# Patient Record
Sex: Male | Born: 1995
Health system: Southern US, Community
[De-identification: ages and names within clinical notes are randomized; demographics above are authoritative.]

## PROBLEM LIST (undated history)

## (undated) DIAGNOSIS — G801 Spastic diplegic cerebral palsy: Secondary | ICD-10-CM

## (undated) DIAGNOSIS — J45909 Unspecified asthma, uncomplicated: Secondary | ICD-10-CM

## (undated) DIAGNOSIS — L732 Hidradenitis suppurativa: Secondary | ICD-10-CM

## (undated) HISTORY — PX: FOOT CAPSULE RELEASE W/ PERCUTANEOUS HEEL CORD LENGTHENING, TIBIAL TENDON TRANSFER: SHX1658

---
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
## 2017-02-09 ENCOUNTER — Telehealth (INDEPENDENT_AMBULATORY_CARE_PROVIDER_SITE_OTHER): Payer: Self-pay | Admitting: *Deleted

## 2017-02-09 NOTE — Telephone Encounter (Signed)
Please read the aforementioned note.

## 2017-02-09 NOTE — Telephone Encounter (Signed)
  Who's calling (name and relationship to patient) : Elnita MaxwellCheryl, mother  Best contact number: [phone]  Provider they see: Elveria Risingina Goodpasture  Reason for call: Mother called in to get a refill on Adderall.  I have scheduled the follow up appt for 6.07.2018.  Mother will pick up the prescription at this appt.     PRESCRIPTION REFILL ONLY  Name of prescription:  Pharmacy:

## 2017-02-10 NOTE — Telephone Encounter (Signed)
Thank you, I will take care of that at his appointment. TG

## 2017-02-12 ENCOUNTER — Ambulatory Visit (INDEPENDENT_AMBULATORY_CARE_PROVIDER_SITE_OTHER): Payer: BLUE CROSS/BLUE SHIELD | Admitting: Family

## 2017-02-24 ENCOUNTER — Ambulatory Visit (INDEPENDENT_AMBULATORY_CARE_PROVIDER_SITE_OTHER): Payer: BLUE CROSS/BLUE SHIELD | Admitting: Family

## 2017-03-04 ENCOUNTER — Encounter (INDEPENDENT_AMBULATORY_CARE_PROVIDER_SITE_OTHER): Payer: Self-pay | Admitting: Family

## 2017-03-04 ENCOUNTER — Ambulatory Visit (INDEPENDENT_AMBULATORY_CARE_PROVIDER_SITE_OTHER): Payer: BLUE CROSS/BLUE SHIELD | Admitting: Family

## 2017-03-04 VITALS — BP 120/80 | HR 88 | Ht 67.5 in | Wt 242.6 lb

## 2017-03-04 DIAGNOSIS — G808 Other cerebral palsy: Secondary | ICD-10-CM | POA: Diagnosis not present

## 2017-03-04 DIAGNOSIS — F70 Mild intellectual disabilities: Secondary | ICD-10-CM

## 2017-03-04 DIAGNOSIS — F902 Attention-deficit hyperactivity disorder, combined type: Secondary | ICD-10-CM | POA: Diagnosis not present

## 2017-03-04 MED ORDER — AMPHETAMINE-DEXTROAMPHETAMINE 10 MG PO TABS: ORAL_TABLET | ORAL | 0 refills | Status: DC

## 2017-03-04 NOTE — Progress Notes (Signed)
Patient: Shawn Tucker MRN: [id number] Sex: male DOB: 11-19-1995  Provider: Elveria Rising, NP Location of Care: [facility]  Note type: Routine return visit  History of Present Illness History from: patient, [facility] chart and his mother Chief Complaint: Follow up on ADHD  Shawn Tucker is a 21 y.o. young man with history of congenital spastic diplegia and attention deficit disorder.He was last seen July 10, 2016. Shawn Tucker treated by Dr. Willaim Bane at [facility] with a selective dorsal rhizotomy in 2003. He had heel cord lengthening procedures by Dr. Allena Katz at [facility] in successive years. He uses forearm crutches and wears bilateral AFO's. He has been also seen by Dr. Dion Saucier at [facility]. He is now being seen in [place] to consolidate care closer to home. He had a brain MRI scan in the past when the family lived in [place] which apparently was normal.   Shawn Tucker has problems with attention, focus and impulsivity. When he was in school, he took generic Adderall on school days, which helped to focus his attention. Shawn Tucker has graduated from high school and recently completed the [employer] in early June. He is now looking for a part time job and his mother is looking for a class to help him with reading comprehension.  Shawn Tucker has been otherwise generally healthy since last seen. Neither he nor his mother have other health concerns for him today other than previously mentioned.  Review of Systems: Please see the HPI for neurologic and other pertinent review of systems. Otherwise, the following systems are noncontributory including constitutional, eyes, ears, nose and throat, cardiovascular, respiratory, gastrointestinal, genitourinary, musculoskeletal, skin, endocrine, hematologic/lymph, allergic/immunologic and psychiatric.   Past Medical History:  Diagnosis Date  . Right axillary hidradenitis 05/2016   recurrent  . Spastic  diplegic cerebral palsy (HCC)    Hospitalizations: No., Head Injury: No., Nervous System Infections: No., Immunizations up to date: Yes.   Past Medical History Comments: born at [redacted] weeks gestation by repeat c-section, had heart murmur in the nursery, cardiology evaluation was normal. Developmentally he was normal other than gross motor skills. He was not walking at 3 year and seemed to have excessive drooling. He was unable to walk until he had a selective dorsal rhizotomy at age 14 yrs at [facility]. He has had heel cord lengthening surgeries at [facility]. He wears AFO's. He has had a normal brain MRI in the past when he lived in [place]. He was evaluated by Dr Dion Saucier at [facility] and placed on Adderall XR for problems with attention and focus.   Surgical History Past Surgical History:  Procedure Laterality Date  . AXILLARY HIDRADENITIS EXCISION Right 07/10/2015  . EXCISION OF ACCESSORY NAVICULAR WITH ADVANCEMENT OF POSTERIOR TIBIAL TENDON Left 02/23/2014   foot  . FOOT CAPSULE RELEASE W/ PERCUTANEOUS HEEL CORD LENGTHENING, TIBIAL TENDON TRANSFER Bilateral   . HYDRADENITIS EXCISION Right 06/11/2016   Procedure: EXCISION RECURRENT  HIDRADENITIS RIGHT AXILLA;  Surgeon: Manus Rudd, MD;  Location: [facility];  Service: General;  Laterality: Right;  . RHIZOTOMY  2003    Family History family history is not on file. Family History is otherwise negative for migraines, seizures, cognitive impairment, blindness, deafness, birth defects, chromosomal disorder, autism.  Social History Social History   Social History  . Marital status: Single    Spouse name: N/A  . Number of children: N/A  . Years of education: N/A   Social History Main Topics  .  Smoking status: Never Smoker  . Smokeless tobacco: Never Used  . Alcohol use No  . Drug use: No  . Sexual activity: No   Other Topics Concern  . None   Social History Narrative   Shawn Tucker graduated from  [employer]. He is doing well. He enjoys playing basketball, moving the lawn and playing video games. He is working at [employer].    Lives with his parents. He has an older sibling that is adult- aged and does not reside in the home.     Allergies No Known Allergies  Physical Exam BP 120/80   Pulse 88   Ht 5' 7.5" (1.715 m)   Wt 242 lb 9.6 oz (110 kg)   BMI 37.44 kg/m  General: well developed, well nourished young man, seated on exam table, in no evident distress, black hair, dark brown eyes, right handed Head: normocephalic and atraumatic.  Ears, Nose and Throat: Oropharynx benign. Neck: supple with no carotid or supraclavicular bruits. Respiratory: lungs clear to auscultation Cardiovascular: regular rate and rhythm, no murmurs. Musculoskeletal: no apparent scoliosis. bilateral tight heel cords, increased tone in both legs.  Skin: no rashes or lesions  Neurologic Exam  Mental Status: Awake and fully alert. Attention span, concentration, and fund of knowledge diminished for age. Speech with mild dysarthria. Able to follow commands and participate in examination. Immature behavior for age. Cranial Nerves: Fundoscopic exam - sharp disc margin with normal vessels. Pupils equal, briskly reactive to light. Extraocular movements full without nystagmus. Visual fields full to confrontation. Hearing intact and symmetric to finger rub. Facial sensation intact. Face, tongue, palate move normally and symmetrically. Neck flexion and extension normal. Motor: Normal bulk and tone. Normal strength in all tested extremity muscles except for lower extremities which have 4/5 strength with increased tone. Sensory: Intact to touch and temperature in all extremities. Coordination: Good fine motor movements, good finger to nose. Clumsy rapid alternating movements. Heel to shin is clumsy due to to weakness. Romberg is negative Gait and Station: Arises from chair without difficulty.  Stance is normal. Gait demonstrates spastic diplegic gait with clumsy, lurching side to side gait. Ambulation was more stable when using his forearm crutches.  Reflexes: 1+ and symmetric. Toes downgoing. No clonus  Impression 1. Congenital spastic diplegia 2.  Attention deficit disorder, combined type 3.  Mild intellectual delay   Recommendations for plan of care The patient's previous [facility] records were reviewed. Yuvaan has neither had nor required imaging or lab studies since the last visit. He is a 21 year old boy with history of congenital spastic diplegia, mild cognitive impairment and attention deficit disorder with impulsivity and hyperactivity. Cleo takes generic Adderall which has helped to focus his attention. Ezio is otherwise healthy and doing well at this time. I will see him back in follow up in 6 months or sooner if needed.  The medication list was reviewed and reconciled.  No changes were made in the prescribed medications today.  A complete medication list was provided to the patient/caregiver.  Allergies as of 03/04/2017   No Known Allergies     Medication List       Accurate as of 03/04/17  3:11 PM. Always use your most recent med list.          amphetamine-dextroamphetamine 10 MG tablet Commonly known as:  ADDERALL Take 1 tablet in the morning for 1 week, then take 2 tablets each morning       Total time spent with the  patient was 20 minutes, of which 50% or more was spent in counseling and coordination of care.   Elveria Risingina Heydi Swango NP-C

## 2017-03-04 NOTE — Patient Instructions (Signed)
Continue your medication as you have been taking it. Let me know if you have difficulty with attention and focus, especially if you find a job and we need to work on adjusting the dose for your work hours.   Please sign up for MyChart if you have not done so.   Please plan to return for follow up in 6 months or sooner if needed.

## 2017-12-01 ENCOUNTER — Telehealth (INDEPENDENT_AMBULATORY_CARE_PROVIDER_SITE_OTHER): Payer: Self-pay | Admitting: Family

## 2017-12-01 NOTE — Telephone Encounter (Signed)
°  Who's calling (name and relationship to patient) : Elnita MaxwellCheryl (mom)  Best contact number: [phone]  Provider they see: Inetta Fermoina  Reason for call: Requesting that script for Adderall be mailed to home address on file. Please call when script has been mailed off.

## 2017-12-01 NOTE — Telephone Encounter (Signed)
Thank you for talking to Mom. TG 

## 2017-12-01 NOTE — Telephone Encounter (Signed)
Called mother to inform her that it was time for Shawn Tucker to be seen. She stated that she figured and we went ahead with scheduling the follow up. Shawn Tucker has been scheduled for Thursday at 9:30 with Shawn Tucker. She stated that she will just get the refill then

## 2017-12-03 ENCOUNTER — Encounter (INDEPENDENT_AMBULATORY_CARE_PROVIDER_SITE_OTHER): Payer: Self-pay | Admitting: Family

## 2017-12-03 ENCOUNTER — Ambulatory Visit (INDEPENDENT_AMBULATORY_CARE_PROVIDER_SITE_OTHER): Payer: BLUE CROSS/BLUE SHIELD | Admitting: Family

## 2017-12-03 VITALS — BP 110/78 | HR 68 | Ht 69.0 in | Wt 254.8 lb

## 2017-12-03 DIAGNOSIS — Z6837 Body mass index (BMI) 37.0-37.9, adult: Secondary | ICD-10-CM | POA: Diagnosis not present

## 2017-12-03 DIAGNOSIS — F70 Mild intellectual disabilities: Secondary | ICD-10-CM | POA: Diagnosis not present

## 2017-12-03 DIAGNOSIS — G808 Other cerebral palsy: Secondary | ICD-10-CM | POA: Diagnosis not present

## 2017-12-03 DIAGNOSIS — E669 Obesity, unspecified: Secondary | ICD-10-CM

## 2017-12-03 DIAGNOSIS — F902 Attention-deficit hyperactivity disorder, combined type: Secondary | ICD-10-CM

## 2017-12-03 DIAGNOSIS — E66812 Obesity, class 2: Secondary | ICD-10-CM

## 2017-12-03 MED ORDER — AMPHETAMINE-DEXTROAMPHETAMINE 10 MG PO TABS: ORAL_TABLET | ORAL | 0 refills | Status: DC

## 2017-12-03 NOTE — Progress Notes (Signed)
Patient: Shawn Tucker MRN: [id number] Sex: male DOB: 11-29-1995  Provider: Elveria Risingina Mirtha Jain, NP [facility]  Note type: Routine return visit  History of Present Illness: History from: mother, patient and [facility] chart Chief Complaint: ADHD/Congenital diplegia  Shawn Tucker is a 22 y.o. young man with history of congenital spastic diplegia and attention deficit disorder. He was last seen March 04, 2017. Shawn Tucker uses forearm crutches and wears bilateral AFO's. He was taking and tolerating generic Adderall IR for problems with attention and focus but stopped it a few months ago when he was not enrolled in school or other activities. Shawn Tucker has graduated from high school and completed the [employer] program last year. He has been unable to find employment or volunteer opportunities. Shawn Tucker has difficulty with reading and his mother is thinking of enrolling him in a reading class at a local community college. Mom has also been working with Shawn Tucker to try to find a job Psychologist, occupationalcoach and employment for [employer]. She restarted the generic Adderall IR a few weeks ago because of increasing problems with focus and attention since being off the medication.   Shawn Tucker has been otherwise generally healthy since he was last seen. Neither he or his mother have other health concerns for him today other than previously mentioned.   Review of Systems: Please see the HPI for neurologic and other pertinent review of systems. Otherwise, all other systems were reviewed and were negative.    Past Medical History:  Diagnosis Date  . Right axillary hidradenitis 05/2016   recurrent  . Spastic diplegic cerebral palsy (HCC)    Hospitalizations: No., Head Injury: No., Nervous System Infections: No., Immunizations up to date: Yes.   Past Medical History Comments: born at 1838 weeks gestation by repeat c-section, had heart murmur in the nursery, cardiology evaluation was normal.  Developmentally he was normal other than gross motor skills. He was not walking at 3 year and seemed to have excessive drooling. He was unable to walk until he had a selective dorsal rhizotomy by Dr Allena KatzPatel at age 17 yrs at [facility]. He has [facility]. He wears AFO's. He has had a normal brain MRI in the past when he lived in [place]. He was [facility] Wor[facility] and placed on Adderall XR for problems with attention and focus.    Surgical History Past Surgical History:  Procedure Laterality Date  . AXILLARY HIDRADENITIS EXCISION Right 07/10/2015  . EXCISION OF ACCESSORY NAVICULAR WITH ADVANCEMENT OF POSTERIOR TIBIAL TENDON Left 02/23/2014   foot  . FOOT CAPSULE RELEASE W/ PERCUTANEOUS HEEL CORD LENGTHENING, TIBIAL TENDON TRANSFER Bilateral   . HYDRADENITIS EXCISION Right 06/11/2016   Procedure: EXCISION RECURRENT  HIDRADENITIS RIGHT AXILLA;  Surgeon: Manus RuddMatthew Tsuei, MD;  Location: [facility];  Service: General;  Laterality: Right;  . RHIZOTOMY  2003    Family History family history is not on file. Family History is otherwise negative for migraines, seizures, cognitive impairment, blindness, deafness, birth defects, chromosomal disorder, autism.  Social History Social History   Socioeconomic History  . Marital status: Single    Spouse name: Not on file  . Number of children: Not on file  . Years of education: Not on file  . Highest education level: Not on file  Occupational History  . Not on file  Social Needs  . Financial resource strain: Not on file  . Food insecurity:  Worry: Not on file    Inability: Not on file  . Transportation needs:    Medical: Not on file    Non-medical: Not on file  Tobacco Use  . Smoking status: Never Smoker  . Smokeless tobacco: Never Used  Substance and Sexual Activity  . Alcohol use: No  . Drug use: No  . Sexual activity: Never  Lifestyle  . Physical  activity:    Days per week: Not on file    Minutes per session: Not on file  . Stress: Not on file  Relationships  . Social connections:    Talks on phone: Not on file    Gets together: Not on file    Attends religious service: Not on file    Active member of club or organization: Not on file    Attends meetings of clubs or organizations: Not on file    Relationship status: Not on file  Other Topics Concern  . Not on file  Social History Narrative   Amy graduated from [employer]. He is doing well. He enjoys playing basketball, mowing the lawn and playing video games. He is working at [employer].    Lives with his parents. He has an older sibling that is adult- aged and does not reside in the home.     Allergies No Known Allergies  Physical Exam BP 110/78   Pulse 68   Ht 5\' 9"  (1.753 m)   Wt 254 lb 12.8 oz (115.6 kg)   BMI 37.63 kg/m  General: well developed, well nourished obese young man, seated on exam table, in no evident distress; black hair, brown eyes, right handed Head: normocephalic and atraumatic. Oropharynx benign. No dysmorphic features. Neck: supple with no carotid bruits. Cardiovascular: regular rate and rhythm, no murmurs. Respiratory: Clear to auscultation bilaterally Abdomen: Bowel sounds present all four quadrants, abdomen soft, non-tender, non-distended. No hepatosplenomegaly or masses palpated.  Musculoskeletal: No skeletal deformities or obvious scoliosis. Has bilateral tight heel cords and increased tone in both lower extremities Skin: no rashes or neurocutaneous lesions  Neurologic Exam Mental Status: Awake and fully alert. Attention span, concentration and fund of knowledge subnormal for age. Speech with mild dysarthria. Able to follow commands and participate in examination. Behavior immature for age.  Cranial Nerves: Fundoscopic exam - red reflex present.  Unable to fully visualize fundus.  Pupils equal briskly reactive to light.   Extraocular movements without nystagmus. Visual fields full to confrontation. Hearing intact and symmetric to whisper. Facial sensation intact with normal facial, tongue and palate movements. Neck flexion and extension and shoulder shrug normal. Motor: Normal bulk and tone. Normal strength in upper extremities with 4/5 strength in lower extremities. He has increased tone in the lower extremities. Sensory: Intact to touch and temperature in all extremities Coordination: Normal finger to nose. Heel to shin is clumsy. Rapid alternating movements are clumsy. Romberg is negative. Gait and Station: Able to independently stand and bear weight. Gait demonstrates spastic diplegia with clumsy, lurching side to side gait. Balance was fairly good. Reflexes: Diminished and symmetric. Toes neutral. No clonus  Impression 1.  Congenital spastic diplegia 2.  Attention deficit disorder, combined type 3.  Mild intellectual delay 4. Obesity   Recommendations for plan of care The patient's previous [facility] records were reviewed. Teoman has neither had nor required imaging or lab studies since the last visit. He is a 22 year old young man with history of congenital spastic diplegia and attention deficit disorder. He is taking  and tolerating generic Adderall IR, which helps to focus his attention. Braylee is doing well at this time with the current dose of generic Adderall IR and I will make no changes in his treatment plan.   I also talked with Tarius and his mother about his weight gain. I encouraged him to work on a healthy eating plan and to get regular exercise. Mom said that she was looking into pool exercise for Vern and I encouraged them to do that.   Wt Readings from Last 3 Encounters:  12/03/17 254 lb 12.8 oz (115.6 kg)  03/04/17 242 lb 9.6 oz (110 kg) (99 %, Z= 2.30)*  06/11/16 200 lb (90.7 kg) (94 %, Z= 1.52)*   * Growth percentiles are based on CDC (Boys, 2-20 Years) data.   I will see him back in  follow up in 1 year or sooner if needed. Lamarco and his mother agreed with the plans made today.  The medication list was reviewed and reconciled.  No changes were made in the prescribed medications today.  A complete medication list was provided to the patient's mother.  Allergies as of 12/03/2017   No Known Allergies     Medication List        Accurate as of 12/03/17 11:59 PM. Always use your most recent med list.          amphetamine-dextroamphetamine 10 MG tablet Commonly known as:  ADDERALL Take 2 tablets each morning and take 1 tablet at midday       Total time spent with the patient was 20 minutes, of which 50% or more was spent in counseling and coordination of care.   Elveria Rising NP-C

## 2017-12-03 NOTE — Patient Instructions (Signed)
Thank you for coming in today.   Instructions for you until your next appointment are as follows: 1. Continue to take generic Adderall 10mg  - 2 tablets in the morning. May take 1 tablet at midday if you have late afternoon activities that need focus and attention. The tablet must be taken by 4pm each day in order for you to be able to get to sleep at night.  2. Work on eating a healthy diet and exercising at least 30 minutes each day. You have gained some weight and I am concerned about you developing problems related to being overweight such as diabetes and high cholesterol. Being at a healthy weight is also easier on your weight bearing joints such as your hips, knees and ankles. I have printed your last 3 weights for you to see.  Wt Readings from Last 3 Encounters:  12/03/17 254 lb 12.8 oz (115.6 kg)  03/04/17 242 lb 9.6 oz (110 kg) (99 %, Z= 2.30)*  06/11/16 200 lb (90.7 kg) (94 %, Z= 1.52)*   * Growth percentiles are based on CDC (Boys, 2-20 Years) data.    Please sign up for MyChart if you have not done so  Please plan to return for follow up in one year or sooner if needed.

## 2017-12-04 ENCOUNTER — Encounter (INDEPENDENT_AMBULATORY_CARE_PROVIDER_SITE_OTHER): Payer: Self-pay | Admitting: Family

## 2017-12-04 DIAGNOSIS — E669 Obesity, unspecified: Secondary | ICD-10-CM | POA: Insufficient documentation

## 2018-04-08 DIAGNOSIS — Z13220 Encounter for screening for lipoid disorders: Secondary | ICD-10-CM | POA: Diagnosis not present

## 2018-04-08 DIAGNOSIS — Z0000 Encounter for general adult medical examination without abnormal findings: Secondary | ICD-10-CM | POA: Diagnosis not present

## 2018-06-23 DIAGNOSIS — Z23 Encounter for immunization: Secondary | ICD-10-CM | POA: Diagnosis not present

## 2018-06-23 DIAGNOSIS — R21 Rash and other nonspecific skin eruption: Secondary | ICD-10-CM | POA: Diagnosis not present

## 2019-02-03 ENCOUNTER — Ambulatory Visit (INDEPENDENT_AMBULATORY_CARE_PROVIDER_SITE_OTHER): Payer: BLUE CROSS/BLUE SHIELD | Admitting: Orthopaedic Surgery

## 2019-02-03 ENCOUNTER — Encounter: Payer: Self-pay | Admitting: Orthopaedic Surgery

## 2019-02-03 ENCOUNTER — Ambulatory Visit: Payer: Self-pay

## 2019-02-03 ENCOUNTER — Other Ambulatory Visit: Payer: Self-pay

## 2019-02-03 VITALS — BP 132/85 | HR 62 | Ht 69.0 in | Wt 254.0 lb

## 2019-02-03 DIAGNOSIS — G8929 Other chronic pain: Secondary | ICD-10-CM

## 2019-02-03 DIAGNOSIS — M25572 Pain in left ankle and joints of left foot: Secondary | ICD-10-CM

## 2019-02-03 NOTE — Progress Notes (Signed)
Office Visit Note   Patient: Shawn OleaSteven Tucker           Date of Birth: 1996-05-11           MRN: [id number] Visit Date: 02/03/2019              Requested by: No referring provider defined for this encounter. PCP: Patient, No Pcp Per   Assessment & Plan: Visit Diagnoses:  1. Chronic pain of left ankle     Plan: Chronic recurrent lateral left ankle pain could be soft tissue in nature.  Has underlying history of diplegia with prior surgery at [facility] involving left ankle and foot.  I do not have any the details regarding his surgery.  Comfortable when he wears equalizer boot but sometimes has a feeling of instability of his ankle.  X-rays were negative for the left ankle.  There is a bony fragment of the calcaneal neck that might be related to his prior surgery.  We will continue with the equalizer boot for ambulation and I will order an MRI scan.  Discussed with his mother who has healthcare power of attorney. I wonder if that abnormal bony fragment at the neck of the os calcis is related to his old surgery and might be a nonunion  Follow-Up Instructions: No follow-ups on file.   Orders:  Orders Placed This Encounter  Procedures   XR Foot Complete Left   XR Ankle Complete Left   No orders of the defined types were placed in this encounter.     Procedures: No procedures performed   Clinical Data: No additional findings.   Subjective: Chief Complaint  Patient presents with   Left Ankle - Pain  Patient presents today with left ankle weakness X 1 month. No known injury. He said that if he does any prolonged walking it gives out and hurts. He has some swelling after prolonged walking. He had a walking boot at home that was given to him a a few years ago and that has helped some. He has a history of CP spastic dyslasia,  bilateral heel lengthening, and left ankle graft. He does not take anything for pain.  Prior surgery performed at [facility] approximately 11 years ago discussion with  his mother on the phone regarding his symptoms  HPI  Review of Systems  Constitutional: Negative for fatigue.  HENT: Negative for ear pain.   Eyes: Negative for pain.  Respiratory: Negative for shortness of breath.   Cardiovascular: Negative for leg swelling.  Gastrointestinal: Negative for constipation and diarrhea.  Endocrine: Negative for cold intolerance and heat intolerance.  Genitourinary: Negative for difficulty urinating.  Musculoskeletal: Negative for joint swelling.  Skin: Negative for rash.  Allergic/Immunologic: Negative for food allergies.  Neurological: Negative for weakness.  Hematological: Does not bruise/bleed easily.  Psychiatric/Behavioral: Negative for sleep disturbance.     Objective: Vital Signs: BP 132/85    Pulse 62    Ht 5\' 9"  (1.753 m)    Wt 254 lb (115.2 kg)    BMI 37.51 kg/m   Physical Exam  Ortho Exam awake alert and oriented x3.  Appears to be comfortable on the examining table.  Does use bilateral upper extremity crutches for ambulation related to his lower extremity weakness with a history of diplegia left ankle had some mild hypertrophic changes and some mild tenderness over the anterior talofibular ligament but he did not have instability negative anterior drawer sign and negative talar tilt.  Has surgical incisions of the medial and  lateral foot.  Decreased subtalar motion but no pain.  Has normal sensibility to the foot and good capillary refill.  I could dorsiflex the foot to about neutral.  No pain with that motion.  No local tenderness about the medial ankle or anterior ankle.  Achilles tendon intact.  Significant pes planus but without plantar pain  Specialty Comments:  No specialty comments available.  Imaging: Xr Ankle Complete Left  Result Date: 02/03/2019 Films of the left ankle were negative for any acute changes.  The ankle mortise was intact.  No evidence of fracture about the medial lateral malleolus.  Talus appears to be intact.   There appears to be an abnormality of the os calcis but will obtain foot films  Xr Foot Complete Left  Result Date: 02/03/2019 As of the left foot were obtained in several projections.  There is an abnormality of the neck of the os calcis.  It appears to be chronic as there is some radiolucency about the bony fragment.  There is no evidence of any extension into the calcaneocuboid joint.  Some ectopic calcification lateral to the fragment could be an attempt at healing.  Remainder the foot appears to be intact other than midfoot sagging and evidence of pes planus    PMFS History: Patient Active Problem List   Diagnosis Date Noted   Chronic pain of left ankle 02/03/2019   Obesity 12/04/2017   Inguinal lymphadenopathy - left groin 01/04/2016   Mass of left inguinal region 12/31/2015   Congenital diplegia (HCC) 07/29/2013   Attention deficit hyperactivity disorder (ADHD) 07/29/2013   Mild intellectual disability 07/29/2013   Past Medical History:  Diagnosis Date   Right axillary hidradenitis 05/2016   recurrent   Spastic diplegic cerebral palsy (HCC)     History reviewed. No pertinent family history.  Past Surgical History:  Procedure Laterality Date   AXILLARY HIDRADENITIS EXCISION Right 07/10/2015   EXCISION OF ACCESSORY NAVICULAR WITH ADVANCEMENT OF POSTERIOR TIBIAL TENDON Left 02/23/2014   foot   FOOT CAPSULE RELEASE W/ PERCUTANEOUS HEEL CORD LENGTHENING, TIBIAL TENDON TRANSFER Bilateral    HYDRADENITIS EXCISION Right 06/11/2016   Procedure: EXCISION RECURRENT  HIDRADENITIS RIGHT AXILLA;  Surgeon: Manus Rudd, MD;  Location: [facility];  Service: General;  Laterality: Right;   RHIZOTOMY  2003   Social History   Occupational History   Not on file  Tobacco Use   Smoking status: Never Smoker   Smokeless tobacco: Never Used  Substance and Sexual Activity   Alcohol use: No   Drug use: No   Sexual activity: Never

## 2019-02-03 NOTE — Addendum Note (Signed)
Addended by: Wendi Maya on: 02/03/2019 11:48 AM   Modules accepted: Orders

## 2019-02-14 ENCOUNTER — Telehealth: Payer: Self-pay | Admitting: Orthopaedic Surgery

## 2019-02-14 NOTE — Telephone Encounter (Signed)
Medicaid pending, IC patient

## 2019-02-14 NOTE — Telephone Encounter (Signed)
Patient's mom Malachy Mood left a voicemail requesting a return call with the status of [facility] referral for an MRI.

## 2019-02-18 NOTE — Telephone Encounter (Signed)
Medicaid still pending, [employer] is authorized

## 2019-02-22 ENCOUNTER — Ambulatory Visit: Payer: BLUE CROSS/BLUE SHIELD | Admitting: Orthopaedic Surgery

## 2019-03-09 ENCOUNTER — Ambulatory Visit
Admission: RE | Admit: 2019-03-09 | Discharge: 2019-03-09 | Disposition: A | Discharge status: Home / Self Care | Payer: BC Managed Care – PPO | Source: Ambulatory Visit | Attending: Orthopaedic Surgery | Admitting: Orthopaedic Surgery

## 2019-03-09 ENCOUNTER — Other Ambulatory Visit: Payer: Self-pay

## 2019-03-09 DIAGNOSIS — M25872 Other specified joint disorders, left ankle and foot: Secondary | ICD-10-CM | POA: Diagnosis not present

## 2019-03-09 DIAGNOSIS — G8929 Other chronic pain: Secondary | ICD-10-CM

## 2019-03-10 ENCOUNTER — Ambulatory Visit (INDEPENDENT_AMBULATORY_CARE_PROVIDER_SITE_OTHER): Payer: BC Managed Care – PPO | Admitting: Orthopaedic Surgery

## 2019-03-10 ENCOUNTER — Encounter: Payer: Self-pay | Admitting: Orthopaedic Surgery

## 2019-03-10 DIAGNOSIS — M25572 Pain in left ankle and joints of left foot: Secondary | ICD-10-CM

## 2019-03-10 DIAGNOSIS — G8929 Other chronic pain: Secondary | ICD-10-CM

## 2019-03-10 NOTE — Progress Notes (Signed)
Office Visit Note   Patient: Shawn OleaSteven Duffy           Date of Birth: 1996-01-10           MRN: [id number] Visit Date: 03/10/2019              Requested by: No referring provider defined for this encounter. PCP: Patient, No Pcp Per   Assessment & Plan: Visit Diagnoses:  1. Chronic pain of left ankle     Plan: MRI scan of the left ankle demonstrated postoperative changes involving the anterior aspect of the calcaneus with a large defect measuring 2 x 2 cm it appears to be a necrotic appearing triangular bone fragment which apparently was a and attempted a bone graft from his prior surgery at [facility].  We do not have his office notes.  There are surgical defects within the medial cuneiform possibly related to an osteotomy or bone graft harvest site.  The ankle ligaments and tendons were intact.  There was thickening of the Achilles tendon probably consistent with his prior lengthening.  He is asymptomatic in that area.  There are subtalar degenerative changes.  Long discussion with Viviann SpareSteven and his mother.  We have discussed bracing injections, etc. he would be more interested in a definitive procedure i.e. surgery.  I would like Dr. Lajoyce Cornersuda to evaluate him.  I think he might be a candidate for subtalar fusion using the old incisions both medially and laterally about the hindfoot.  I think he also should have exploration of that bone graft area that appears to be necrotic.  He does have altered sensibility and altered motor function of that foot but does have sensation of instability and pain  Follow-Up Instructions: Return Refer to Dr. Lajoyce Cornersuda.   Orders:  No orders of the defined types were placed in this encounter.  No orders of the defined types were placed in this encounter.     Procedures: No procedures performed   Clinical Data: No additional findings.   Subjective: No chief complaint on file. Viviann SpareSteven is accompanied by his mother and here for follow-up evaluation of the MRI scan of his  left ankle.  Has a history of congenital diplegia and uses bilateral crutches.  His biggest problem is with a feeling of pain and instability about his left foot and ankle.  He had prior surgery at [facility] but we do not have any of the operative reports or notes.  HPI  Review of Systems   Objective: Vital Signs: There were no vitals taken for this visit.  Physical Exam Constitutional:      Appearance: He is well-developed.  Eyes:     Pupils: Pupils are equal, round, and reactive to light.  Pulmonary:     Effort: Pulmonary effort is normal.  Skin:    General: Skin is warm and dry.  Neurological:     Mental Status: He is alert and oriented to person, place, and time.  Psychiatric:        Behavior: Behavior normal.     Ortho Exam left foot without erythema or ecchymosis.  There is an area about the size of the silver dollar in the dorsum of his foot is related to excoriation from his shoes there is some clear serous fluid and I suggested they may want to use triple antibiotic ointment and waterproof Band-Aid.  He does have some pain with subtalar motion which is limited.  He has intact motor is for dorsiflexion of his foot to about neutral  but is unable to invert or evert his foot.  Well-healed incisions longitudinally along the medial aspect of his foot with some mild tenderness more laterally than medially.  Achilles tendon is not uncomfortable but "knotty" related to his prior Achilles tendon lengthening.  Sensation is altered.  No distal edema.  Specialty Comments:  No specialty comments available.  Imaging: No results found.   PMFS History: Patient Active Problem List   Diagnosis Date Noted  . Chronic pain of left ankle 02/03/2019  . Obesity 12/04/2017  . Inguinal lymphadenopathy - left groin 01/04/2016  . Mass of left inguinal region 12/31/2015  . Congenital diplegia ([place]) 07/29/2013  . Attention deficit hyperactivity disorder (ADHD) 07/29/2013  . Mild intellectual  disability 07/29/2013   Past Medical History:  Diagnosis Date  . Right axillary hidradenitis 05/2016   recurrent  . Spastic diplegic cerebral palsy ([place])     History reviewed. No pertinent family history.  Past Surgical History:  Procedure Laterality Date  . AXILLARY HIDRADENITIS EXCISION Right 07/10/2015  . EXCISION OF ACCESSORY NAVICULAR WITH ADVANCEMENT OF POSTERIOR TIBIAL TENDON Left 02/23/2014   foot  . FOOT CAPSULE RELEASE W/ PERCUTANEOUS HEEL CORD LENGTHENING, TIBIAL TENDON TRANSFER Bilateral   . HYDRADENITIS EXCISION Right 06/11/2016   Procedure: EXCISION RECURRENT  HIDRADENITIS RIGHT AXILLA;  Surgeon: Donnie Mesa, MD;  Location: [place];  Service: General;  Laterality: Right;  . RHIZOTOMY  2003   Social History   Occupational History  . Not on file  Tobacco Use  . Smoking status: Never Smoker  . Smokeless tobacco: Never Used  Substance and Sexual Activity  . Alcohol use: No  . Drug use: No  . Sexual activity: Never     Garald Balding, MD   Note - This record has been created using [employer].  Chart creation errors have been sought, but may not always  have been located. Such creation errors do not reflect on  the standard of medical care.

## 2019-03-14 ENCOUNTER — Encounter: Payer: Self-pay | Admitting: Orthopedic Surgery

## 2019-03-14 ENCOUNTER — Ambulatory Visit (INDEPENDENT_AMBULATORY_CARE_PROVIDER_SITE_OTHER): Payer: BC Managed Care – PPO | Admitting: Orthopedic Surgery

## 2019-03-14 VITALS — Ht 69.0 in | Wt 254.0 lb

## 2019-03-14 DIAGNOSIS — G801 Spastic diplegic cerebral palsy: Secondary | ICD-10-CM

## 2019-03-14 DIAGNOSIS — M25572 Pain in left ankle and joints of left foot: Secondary | ICD-10-CM

## 2019-03-14 NOTE — Progress Notes (Addendum)
Office Visit Note   Patient: Shawn Tucker           Date of Birth: 1996/03/14           MRN: [id number] Visit Date: 03/14/2019              Requested by: No referring provider defined for this encounter. PCP: Patient, No Pcp Per  Chief Complaint  Patient presents with   Left Foot - Pain      HPI: Patient is a 23 year old gentleman with cerebral palsy status post left foot reconstruction at [facility] years ago.  By report patient had Achilles lengthening due to his equinus contracture.  There is also multiple scars but the exact procedure is not available.  Patient complains of giving way and weakness with pain over the medial and lateral aspect of his foot.  Patient has previously used a posterior AFO but is currently not wearing it.  Patient is seen for initial evaluation and consultation for Dr. Durward Fortes.  Assessment & Plan: Visit Diagnoses:  1. Spastic diplegic cerebral palsy (HCC)   2. Pain in left ankle and joints of left foot     Plan: With patient's weakness and spasticity will start with an anterior AFO.  Prescription was written for an anterior AFO and custom orthotic.  Prescription was written for [facility] they have used [facility] in the past.  We will follow-up once this is obtained.  Follow-Up Instructions: Return in about 2 months (around 05/15/2019).   Ortho Exam  Patient is alert, oriented, no adenopathy, well-dressed, normal affect, normal respiratory effort. Examination patient has a good dorsalis pedis pulse with manipulation I can get his foot up to neutral.  He does not have good extension or plantar flexion strength of the left foot.  There is a very long incision over the Achilles and MRI scan does show increased thickness of the Achilles consistent with previous lengthening procedure.  There is an incision over the medial aspect of his foot as well as an incision over the sinus Tarsi.  In review of the radiographs it appears that a bone graft was harvested from  the medial cuneiform and this was placed in the calcaneal neck to attempt a lengthening of the calcaneus however the plantar cortex of the calcaneus was not violated so I am not sure the purpose of the wedge of bone for the medial cuneiform into the calcaneus.  There is a definite nonunion at the insertion site.  There is some degenerative changes both radiographically and by MRI scan in the subtalar joint.  With palpation the patient has global tenderness around the foot and ankle no definite area of maximal pain or tenderness.  Patient's MRI scan and radiographs were both reviewed.  The findings of the MRI scan show postoperative changes involving the anterior aspect of the calcaneus with a bone block with a triangular bone fragment which appears to have come from the medial cuneiform.  There is subtalar fluid consistent with subtalar arthritis.  There is the medial cuneiform surgical defect from the bone harvesting the ligaments of the ankle are intact and there is marked thickening of the Achilles tendon. Patient uses [place] crutches in both upper extremities for ambulation.  Imaging: No results found. No images are attached to the encounter.  Labs: No results found for: HGBA1C, ESRSEDRATE, CRP, LABURIC, REPTSTATUS, GRAMSTAIN, CULT, LABORGA   No results found for: ALBUMIN, PREALBUMIN, LABURIC  No results found for: MG No results found for: VD25OH  No results  found for: PREALBUMIN CBC EXTENDED Latest Ref Rng & Units 01/14/2016  WBC 4.0 - 10.5 K/uL 4.7  RBC 4.22 - 5.81 MIL/uL 4.41  HGB 13.0 - 17.0 g/dL 78.214.4  HCT 95.639.0 - 21.352.0 % 42.5  PLT 150 - 400 K/uL 223     Body mass index is 37.51 kg/m.  Orders:  No orders of the defined types were placed in this encounter.  No orders of the defined types were placed in this encounter.    Procedures: No procedures performed  Clinical Data: No additional findings.  ROS:  All other systems negative, except as noted in the HPI. Review  of Systems  Objective: Vital Signs: Ht 5\' 9"  (1.753 m)    Wt 254 lb (115.2 kg)    BMI 37.51 kg/m   Specialty Comments:  No specialty comments available.  PMFS History: Patient Active Problem List   Diagnosis Date Noted   Chronic pain of left ankle 02/03/2019   Obesity 12/04/2017   Inguinal lymphadenopathy - left groin 01/04/2016   Mass of left inguinal region 12/31/2015   Congenital diplegia (HCC) 07/29/2013   Attention deficit hyperactivity disorder (ADHD) 07/29/2013   Mild intellectual disability 07/29/2013   Past Medical History:  Diagnosis Date   Right axillary hidradenitis 05/2016   recurrent   Spastic diplegic cerebral palsy (HCC)     History reviewed. No pertinent family history.  Past Surgical History:  Procedure Laterality Date   AXILLARY HIDRADENITIS EXCISION Right 07/10/2015   EXCISION OF ACCESSORY NAVICULAR WITH ADVANCEMENT OF POSTERIOR TIBIAL TENDON Left 02/23/2014   foot   FOOT CAPSULE RELEASE W/ PERCUTANEOUS HEEL CORD LENGTHENING, TIBIAL TENDON TRANSFER Bilateral    HYDRADENITIS EXCISION Right 06/11/2016   Procedure: EXCISION RECURRENT  HIDRADENITIS RIGHT AXILLA;  Surgeon: Manus RuddMatthew Tsuei, MD;  Location: [facility];  Service: General;  Laterality: Right;   RHIZOTOMY  2003   Social History   Occupational History   Not on file  Tobacco Use   Smoking status: Never Smoker   Smokeless tobacco: Never Used  Substance and Sexual Activity   Alcohol use: No   Drug use: No   Sexual activity: Never

## 2019-03-15 ENCOUNTER — Ambulatory Visit: Payer: BC Managed Care – PPO | Admitting: Orthopaedic Surgery

## 2019-03-24 ENCOUNTER — Telehealth: Payer: Self-pay | Admitting: Orthopaedic Surgery

## 2019-03-24 NOTE — Telephone Encounter (Signed)
Patient's mom Malachy Mood called stating they have an appointment today at 10:00 at [facility] in [place] for patient's brace fitting.  Malachy Mood states she cannot find the prescription that Dr. Durward Fortes gave her to bring to the appointment.  Malachy Mood states she will call back when she gets to the clinic to provide a fax # for the prescription to be sent.

## 2019-03-24 NOTE — Telephone Encounter (Signed)
Can you please send another script to [facility] for patient?  Thanks!!

## 2019-03-25 NOTE — Telephone Encounter (Signed)
[facility] Rx sent for patient

## 2019-03-25 NOTE — Telephone Encounter (Signed)
Patient mother Malachy Mood was called and notified, lvm to return call if she had any other questions or concerns.

## 2019-04-28 DIAGNOSIS — M25572 Pain in left ankle and joints of left foot: Secondary | ICD-10-CM | POA: Diagnosis not present

## 2019-04-28 DIAGNOSIS — G801 Spastic diplegic cerebral palsy: Secondary | ICD-10-CM | POA: Diagnosis not present

## 2019-05-12 DIAGNOSIS — Z0000 Encounter for general adult medical examination without abnormal findings: Secondary | ICD-10-CM | POA: Diagnosis not present

## 2019-05-12 DIAGNOSIS — Z23 Encounter for immunization: Secondary | ICD-10-CM | POA: Diagnosis not present

## 2019-05-12 DIAGNOSIS — Z13220 Encounter for screening for lipoid disorders: Secondary | ICD-10-CM | POA: Diagnosis not present

## 2019-05-12 DIAGNOSIS — G809 Cerebral palsy, unspecified: Secondary | ICD-10-CM | POA: Diagnosis not present

## 2019-05-17 ENCOUNTER — Ambulatory Visit (INDEPENDENT_AMBULATORY_CARE_PROVIDER_SITE_OTHER): Payer: BC Managed Care – PPO | Admitting: Orthopedic Surgery

## 2019-05-17 ENCOUNTER — Encounter: Payer: Self-pay | Admitting: Orthopedic Surgery

## 2019-05-17 VITALS — Ht 69.0 in | Wt 254.0 lb

## 2019-05-17 DIAGNOSIS — B353 Tinea pedis: Secondary | ICD-10-CM

## 2019-05-17 DIAGNOSIS — G801 Spastic diplegic cerebral palsy: Secondary | ICD-10-CM

## 2019-06-07 ENCOUNTER — Encounter: Payer: Self-pay | Admitting: Orthopedic Surgery

## 2019-06-07 NOTE — Progress Notes (Signed)
Office Visit Note   Patient: Shawn Tucker           Date of Birth: [birth year]           MRN: [id number] Visit Date: 05/17/2019              Requested by: No referring provider defined for this encounter. PCP: Patient, No Pcp Per  Chief Complaint  Patient presents with   Left Foot - Follow-up      HPI: Patient is a 23 year old gentleman who is seen in follow-up for spastic diplegia he is currently wearing an anterior AFO from [facility].  Patient also complains of a rash on the dorsum of the right foot.  Patient also complains of left foot pain status post Achilles lengthening.  Patient is currently on Keflex for 7 days from his primary care provider.  Patient states he has had a draining rash from the dorsum of the right foot.  Assessment & Plan: Visit Diagnoses:  1. Spastic diplegic cerebral palsy (HCC)   2. Fungal infection right foot     Plan: Patient was given a sample pair of double extra-large compression socks he is to wear this around the clock wash the foot daily with soap and water and reapply the sock and wash the sock as needed.  Follow-Up Instructions: Return in about 2 months (around 07/17/2019).   Ortho Exam  Patient is alert, oriented, no adenopathy, well-dressed, normal affect, normal respiratory effort. On examination patient has a fungal rash on the dorsum of the right foot.  There is no ascending cellulitis there is clear drainage he does have venous stasis swelling in his leg.  Patient has an anterior AFO on the left this maintains his foot at 90 degrees.  Patient is ambulating better.  Imaging: No results found.   Labs: No results found for: HGBA1C, ESRSEDRATE, CRP, LABURIC, REPTSTATUS, GRAMSTAIN, CULT, LABORGA   No results found for: ALBUMIN, PREALBUMIN, LABURIC  No results found for: MG No results found for: VD25OH  No results found for: PREALBUMIN CBC EXTENDED Latest Ref Rng & Units 01/14/2016  WBC 4.0 - 10.5 K/uL 4.7  RBC 4.22 - 5.81 MIL/uL  4.41  HGB 13.0 - 17.0 g/dL 42.7  HCT 06.2 - 37.6 % 42.5  PLT 150 - 400 K/uL 223     Body mass index is 37.51 kg/m.  Orders:  No orders of the defined types were placed in this encounter.  No orders of the defined types were placed in this encounter.    Procedures: No procedures performed  Clinical Data: No additional findings.  ROS:  All other systems negative, except as noted in the HPI. Review of Systems  Objective: Vital Signs: Ht 5\' 9"  (1.753 m)    Wt 254 lb (115.2 kg)    BMI 37.51 kg/m   Specialty Comments:  No specialty comments available.  PMFS History: Patient Active Problem List   Diagnosis Date Noted   Chronic pain of left ankle 02/03/2019   Obesity 12/04/2017   Inguinal lymphadenopathy - left groin 01/04/2016   Mass of left inguinal region 12/31/2015   Congenital diplegia (HCC) 07/29/2013   Attention deficit hyperactivity disorder (ADHD) 07/29/2013   Mild intellectual disability 07/29/2013   Past Medical History:  Diagnosis Date   Right axillary hidradenitis 05/2016   recurrent   Spastic diplegic cerebral palsy (HCC)     History reviewed. No pertinent family history.  Past Surgical History:  Procedure Laterality Date   AXILLARY HIDRADENITIS EXCISION Right  07/10/2015   EXCISION OF ACCESSORY NAVICULAR WITH ADVANCEMENT OF POSTERIOR TIBIAL TENDON Left 02/23/2014   foot   FOOT CAPSULE RELEASE W/ PERCUTANEOUS HEEL CORD LENGTHENING, TIBIAL TENDON TRANSFER Bilateral    HYDRADENITIS EXCISION Right 06/11/2016   Procedure: EXCISION RECURRENT  HIDRADENITIS RIGHT AXILLA;  Surgeon: Donnie Mesa, MD;  Location: [place];  Service: General;  Laterality: Right;   RHIZOTOMY  2003   Social History   Occupational History   Not on file  Tobacco Use   Smoking status: Never Smoker   Smokeless tobacco: Never Used  Substance and Sexual Activity   Alcohol use: No   Drug use: No   Sexual activity: Never

## 2019-07-12 ENCOUNTER — Ambulatory Visit: Payer: Medicaid Other | Admitting: Orthopedic Surgery

## 2019-07-18 ENCOUNTER — Encounter: Payer: Self-pay | Admitting: Orthopedic Surgery

## 2019-07-18 ENCOUNTER — Ambulatory Visit (INDEPENDENT_AMBULATORY_CARE_PROVIDER_SITE_OTHER): Payer: BC Managed Care – PPO | Admitting: Orthopedic Surgery

## 2019-07-18 VITALS — Ht 69.0 in | Wt 254.0 lb

## 2019-07-18 DIAGNOSIS — B353 Tinea pedis: Secondary | ICD-10-CM

## 2019-07-18 DIAGNOSIS — G801 Spastic diplegic cerebral palsy: Secondary | ICD-10-CM | POA: Diagnosis not present

## 2019-07-19 ENCOUNTER — Encounter: Payer: Self-pay | Admitting: Orthopedic Surgery

## 2019-07-19 NOTE — Progress Notes (Signed)
Office Visit Note   Patient: Shawn Tucker           Date of Birth: Mar 27, 1996           MRN: [id number] Visit Date: 07/18/2019              Requested by: No referring provider defined for this encounter. PCP: Patient, No Pcp Per  Chief Complaint  Patient presents with  . Left Foot - Follow-up      HPI: Patient is a 23 year old gentleman with spastic diplegia with a fungal wound on the dorsum of the left foot.  Patient feels like he is doing well still has drainage from the dorsal wound on his foot.  Patient is just started wearing his anterior AFO for the foot drop and he feels like the brace is doing well.  Patient states that he lost his sock for compression.  Assessment & Plan: Visit Diagnoses:  1. Spastic diplegic cerebral palsy (HCC)   2. Fungal infection right foot     Plan: Resume wearing the compression socks ideally around the clock if possible.  Follow-Up Instructions: Return in about 2 months (around 09/17/2019).   Ortho Exam  Patient is alert, oriented, no adenopathy, well-dressed, normal affect, normal respiratory effort. Examination patient has venous swelling in both legs his calf measures 45 cm in circumference.  He has a draining hyper hydrocyst lesion on the dorsum of his left foot which has increased skin pigment color the drainage is clear there is no tenderness to palpation no cellulitis no signs of infection.  The area of hyperhidrosis is 6 cm in diameter this appears more consistent with a fungal type rash.  Imaging: No results found. No images are attached to the encounter.  Labs: No results found for: HGBA1C, ESRSEDRATE, CRP, LABURIC, REPTSTATUS, GRAMSTAIN, CULT, LABORGA   No results found for: ALBUMIN, PREALBUMIN, LABURIC  No results found for: MG No results found for: VD25OH  No results found for: PREALBUMIN CBC EXTENDED Latest Ref Rng & Units 01/14/2016  WBC 4.0 - 10.5 K/uL 4.7  RBC 4.22 - 5.81 MIL/uL 4.41  HGB 13.0 - 17.0 g/dL 86.7  HCT  61.9 - 50.9 % 42.5  PLT 150 - 400 K/uL 223     Body mass index is 37.51 kg/m.  Orders:  No orders of the defined types were placed in this encounter.  No orders of the defined types were placed in this encounter.    Procedures: No procedures performed  Clinical Data: No additional findings.  ROS:  All other systems negative, except as noted in the HPI. Review of Systems  Objective: Vital Signs: Ht 5\' 9"  (1.753 m)   Wt 254 lb (115.2 kg)   BMI 37.51 kg/m   Specialty Comments:  No specialty comments available.  PMFS History: Patient Active Problem List   Diagnosis Date Noted  . Chronic pain of left ankle 02/03/2019  . Obesity 12/04/2017  . Inguinal lymphadenopathy - left groin 01/04/2016  . Mass of left inguinal region 12/31/2015  . Congenital diplegia (HCC) 07/29/2013  . Attention deficit hyperactivity disorder (ADHD) 07/29/2013  . Mild intellectual disability 07/29/2013   Past Medical History:  Diagnosis Date  . Right axillary hidradenitis 05/2016   recurrent  . Spastic diplegic cerebral palsy (HCC)     History reviewed. No pertinent family history.  Past Surgical History:  Procedure Laterality Date  . AXILLARY HIDRADENITIS EXCISION Right 07/10/2015  . EXCISION OF ACCESSORY NAVICULAR WITH ADVANCEMENT OF POSTERIOR TIBIAL TENDON Left  02/23/2014   foot  . FOOT CAPSULE RELEASE W/ PERCUTANEOUS HEEL CORD LENGTHENING, TIBIAL TENDON TRANSFER Bilateral   . HYDRADENITIS EXCISION Right 06/11/2016   Procedure: EXCISION RECURRENT  HIDRADENITIS RIGHT AXILLA;  Surgeon: Donnie Mesa, MD;  Location: [facility];  Service: General;  Laterality: Right;  . RHIZOTOMY  2003   Social History   Occupational History  . Not on file  Tobacco Use  . Smoking status: Never Smoker  . Smokeless tobacco: Never Used  Substance and Sexual Activity  . Alcohol use: No  . Drug use: No  . Sexual activity: Never

## 2019-08-15 ENCOUNTER — Encounter: Payer: Self-pay | Admitting: Orthopedic Surgery

## 2019-08-15 ENCOUNTER — Ambulatory Visit (INDEPENDENT_AMBULATORY_CARE_PROVIDER_SITE_OTHER): Payer: BC Managed Care – PPO | Admitting: Orthopedic Surgery

## 2019-08-15 ENCOUNTER — Other Ambulatory Visit: Payer: Self-pay

## 2019-08-15 VITALS — Ht 69.0 in | Wt 254.0 lb

## 2019-08-15 DIAGNOSIS — G801 Spastic diplegic cerebral palsy: Secondary | ICD-10-CM | POA: Diagnosis not present

## 2019-08-15 DIAGNOSIS — B353 Tinea pedis: Secondary | ICD-10-CM | POA: Diagnosis not present

## 2019-08-15 NOTE — Progress Notes (Signed)
Office Visit Note   Patient: Shawn Tucker           Date of Birth: [birth year]           MRN: [id number] Visit Date: 08/15/2019              Requested by: No referring provider defined for this encounter. PCP: Patient, No Pcp Per  Chief Complaint  Patient presents with  . Left Foot - Follow-up      HPI: Patient is a 23 year old gentleman who was seen with his mother status post treatment for a fungal infection dorsum of the left foot.  Patient wears a anterior AFO he is currently wearing the medical compression stocking.  He states that the wound is no longer draining it is nontender patient feels like it is resolved well.  Assessment & Plan: Visit Diagnoses:  1. Spastic diplegic cerebral palsy (HCC)   2. Infection, fungal, left foot     Plan: Patient will continue to wear the medical compression stocking may use moisturizer as needed follow-up as needed.  Follow-Up Instructions: Return if symptoms worsen or fail to improve.   Ortho Exam  Patient is alert, oriented, no adenopathy, well-dressed, normal affect, normal respiratory effort. Examination the area of dermatitis has resolved.  There is no drainage no tenderness to palpation the skin is soft and mobile there is no redness.  There is still dark skin discolorationNo results found. No images are attached to the encounter.  Labs: No results found for: HGBA1C, ESRSEDRATE, CRP, LABURIC, REPTSTATUS, GRAMSTAIN, CULT, LABORGA   No results found for: ALBUMIN, PREALBUMIN, LABURIC  No results found for: MG No results found for: VD25OH  No results found for: PREALBUMIN CBC EXTENDED Latest Ref Rng & Units 01/14/2016  WBC 4.0 - 10.5 K/uL 4.7  RBC 4.22 - 5.81 MIL/uL 4.41  HGB 13.0 - 17.0 g/dL 54.9  HCT 82.6 - 41.5 % 42.5  PLT 150 - 400 K/uL 223     Body mass index is 37.51 kg/m.  Orders:  No orders of the defined types were placed in this encounter.  No orders of the defined types were placed in this encounter.    Procedures: No procedures performed  Clinical Data: No additional findings.  ROS:  All other systems negative, except as noted in the HPI. Review of Systems  Objective: Vital Signs: Ht 5\' 9"  (1.753 m)   Wt 254 lb (115.2 kg)   BMI 37.51 kg/m   Specialty Comments:  No specialty comments available.  PMFS History: Patient Active Problem List   Diagnosis Date Noted  . Chronic pain of left ankle 02/03/2019  . Obesity 12/04/2017  . Inguinal lymphadenopathy - left groin 01/04/2016  . Mass of left inguinal region 12/31/2015  . Congenital diplegia (HCC) 07/29/2013  . Attention deficit hyperactivity disorder (ADHD) 07/29/2013  . Mild intellectual disability 07/29/2013   Past Medical History:  Diagnosis Date  . Right axillary hidradenitis 05/2016   recurrent  . Spastic diplegic cerebral palsy (HCC)     History reviewed. No pertinent family history.  Past Surgical History:  Procedure Laterality Date  . AXILLARY HIDRADENITIS EXCISION Right 07/10/2015  . EXCISION OF ACCESSORY NAVICULAR WITH ADVANCEMENT OF POSTERIOR TIBIAL TENDON Left 02/23/2014   foot  . FOOT CAPSULE RELEASE W/ PERCUTANEOUS HEEL CORD LENGTHENING, TIBIAL TENDON TRANSFER Bilateral   . HYDRADENITIS EXCISION Right 06/11/2016   Procedure: EXCISION RECURRENT  HIDRADENITIS RIGHT AXILLA;  Surgeon: 08/11/2016, MD;  Location: [facility];  Service:  General;  Laterality: Right;  . RHIZOTOMY  2003   Social History   Occupational History  . Not on file  Tobacco Use  . Smoking status: Never Smoker  . Smokeless tobacco: Never Used  Substance and Sexual Activity  . Alcohol use: No  . Drug use: No  . Sexual activity: Never

## 2019-10-28 DIAGNOSIS — L732 Hidradenitis suppurativa: Secondary | ICD-10-CM | POA: Diagnosis not present

## 2020-05-17 DIAGNOSIS — Z0000 Encounter for general adult medical examination without abnormal findings: Secondary | ICD-10-CM | POA: Diagnosis not present

## 2020-05-17 DIAGNOSIS — Z13220 Encounter for screening for lipoid disorders: Secondary | ICD-10-CM | POA: Diagnosis not present

## 2020-07-11 IMAGING — MR MRI OF THE LEFT ANKLE WITHOUT CONTRAST
5 series · 36 of 40 positions shown · non-contrast
Comparison: Radiographs 02/03/2019

CLINICAL DATA: Chronic foot and ankle pain and weakness. History of
remote surgery.

EXAM:
MRI OF THE LEFT ANKLE WITHOUT CONTRAST
TECHNIQUE: Multiplanar, multisequence MR imaging of the ankle was performed. No
intravenous contrast was administered.

[Series 4: T2 fat-sat · axial · 3.0mm · 0.50mm/px · z∈[-90,+43]mm · 9 of 35 slices shown (1 of 2)]
[im 1/35]
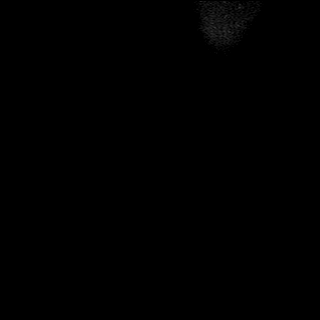
[im 5/35]
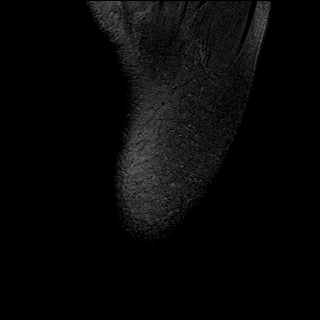
[im 9/35]
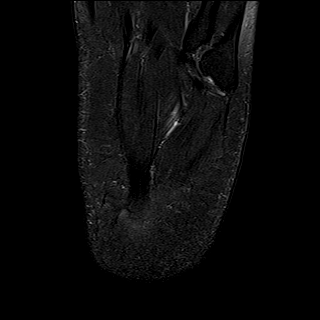
[im 13/35]
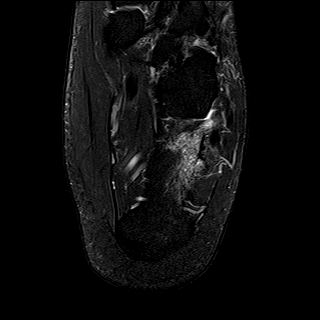
[im 18/35]
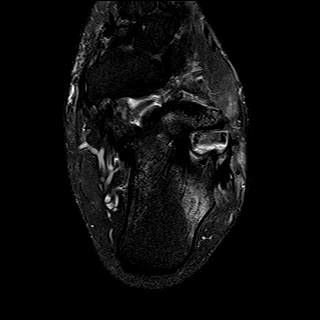
[im 22/35]
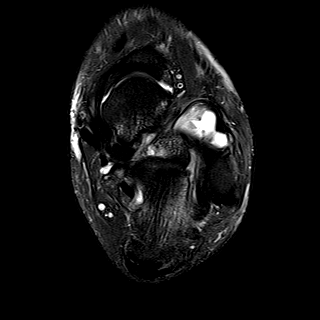
[im 26/35]
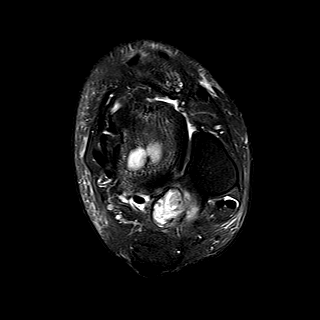
[im 30/35]
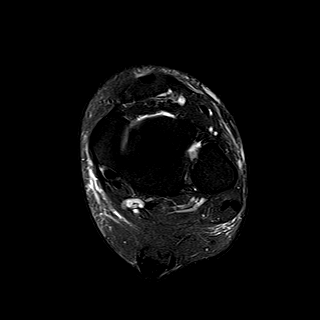
[im 35/35]
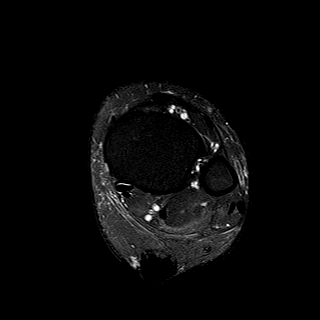

[Series 5: PD fat-sat · axial · 3.0mm · 0.42mm/px · z∈[-90,+43]mm · 9 of 35 slices shown]
[im 1/35]
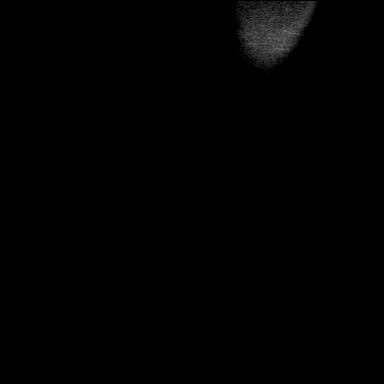
[im 5/35]
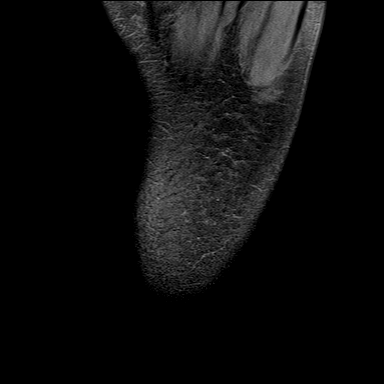
[im 9/35]
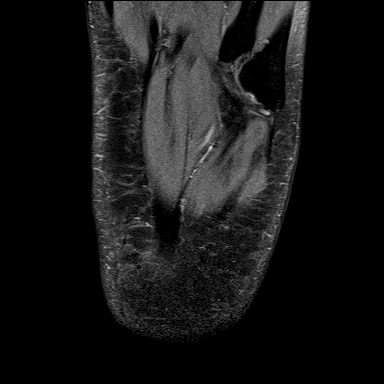
[im 13/35]
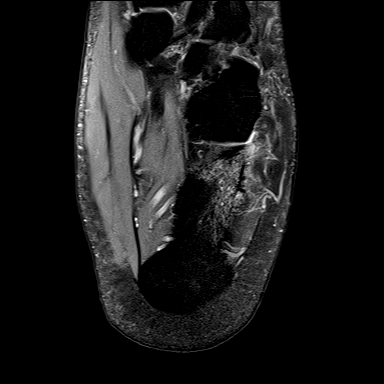
[im 18/35]
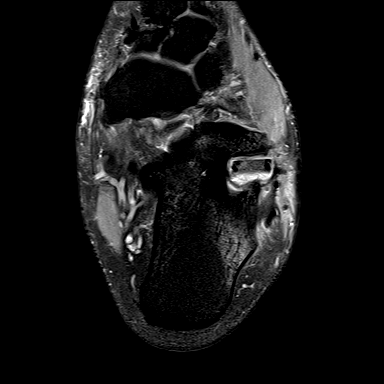
[im 22/35]
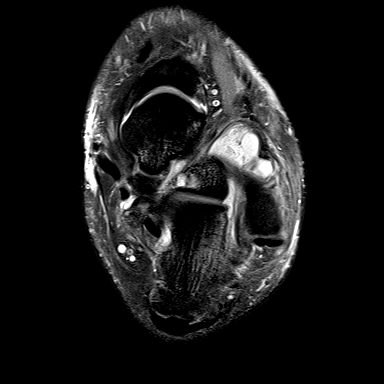
[im 26/35]
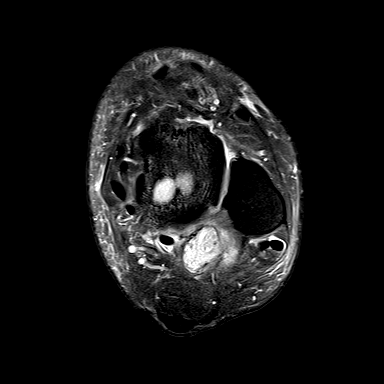
[im 30/35]
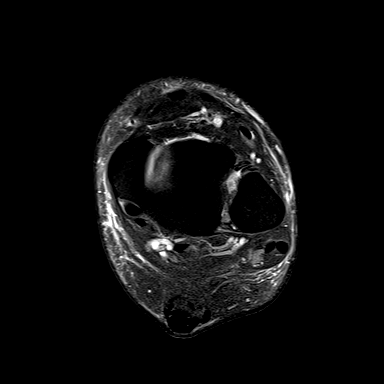
[im 35/35]
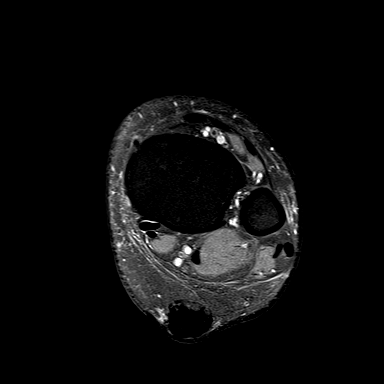

[Series 6: T1 · sagittal · 4.0mm · 0.56mm/px · 6 of 20 slices shown]
[im 1/20]
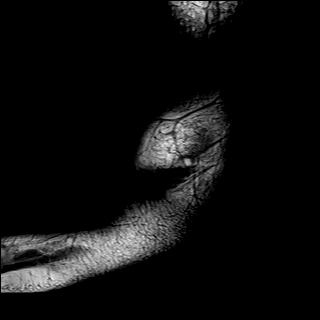
[im 4/20]
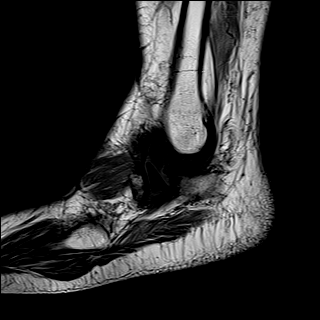
[im 8/20]
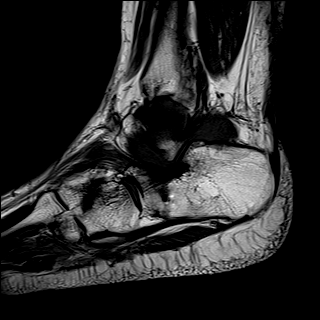
[im 12/20]
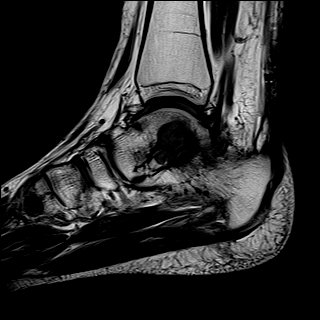
[im 16/20]
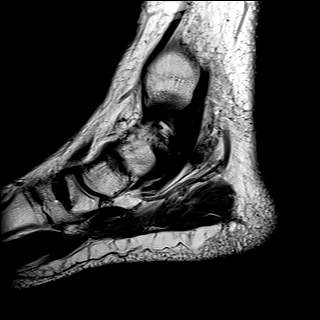
[im 20/20]
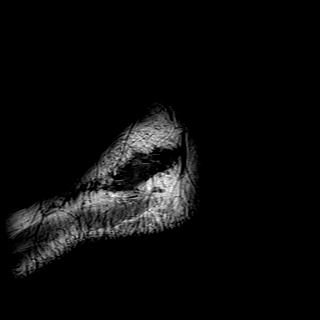

[Series 7: STIR · sagittal · 4.0mm · 0.35mm/px · 4 of 20 slices shown]
[im 1/20]
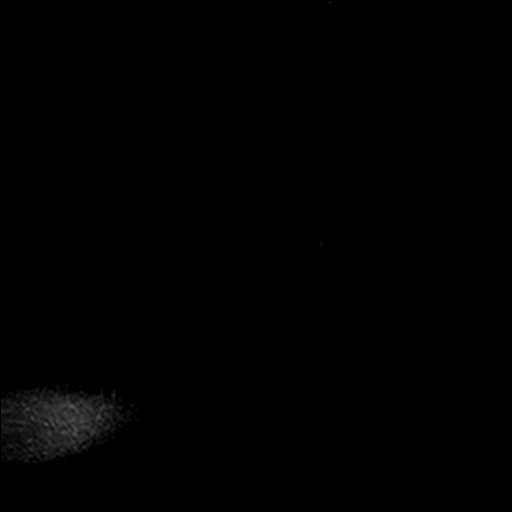
[im 4/20]
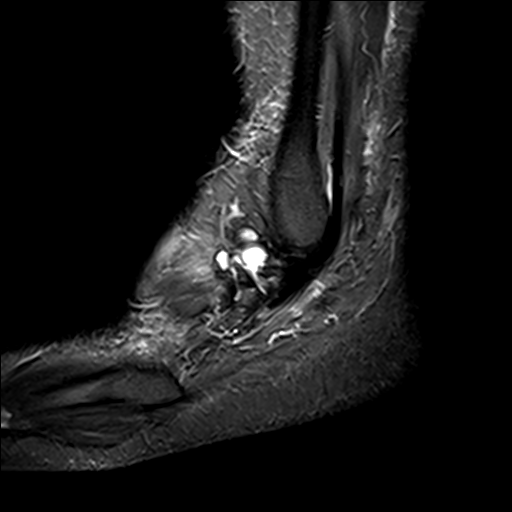
[im 8/20]
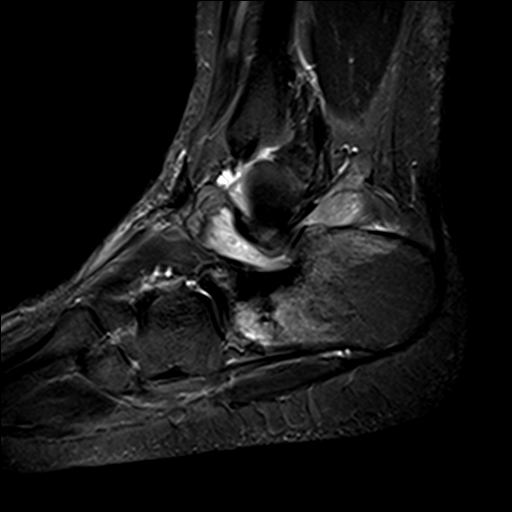
[im 12/20]
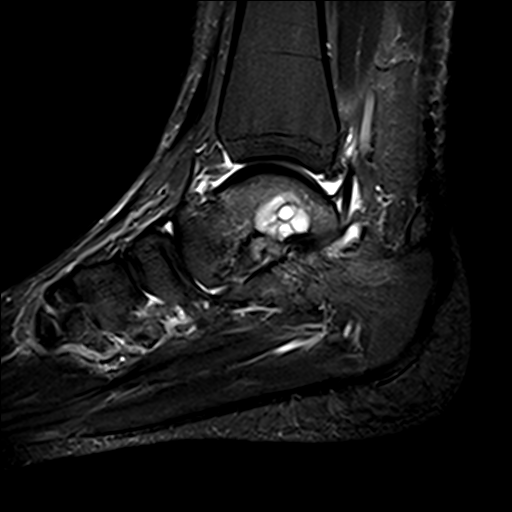

[Series 8: T2 fat-sat · coronal · 3.0mm · 0.50mm/px · 8 of 35 slices shown (2 of 2)]
[im 1/35]
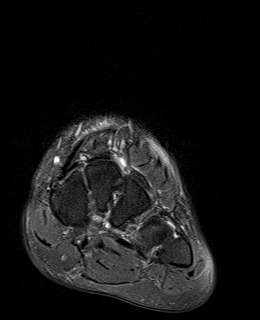
[im 4/35]
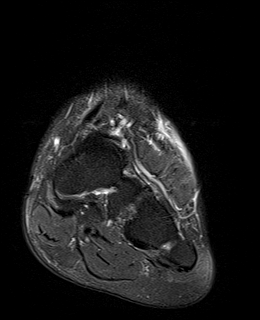
[im 12/35]
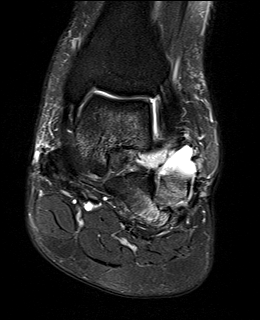
[im 16/35]
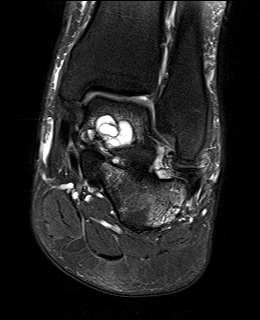
[im 19/35]
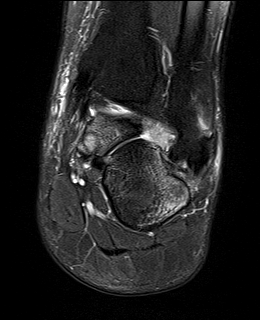
[im 23/35]
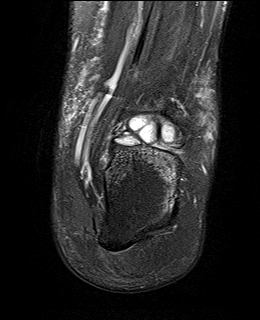
[im 31/35]
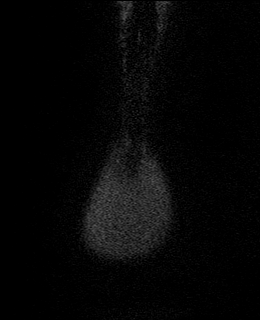
[im 35/35]
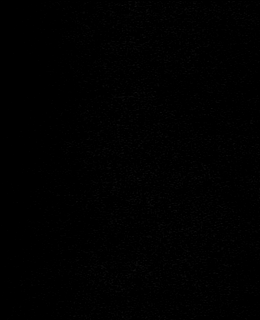

[36 of 40 positions shown; findings below may reference images not displayed]

FINDINGS: TENDONS

Peroneal: Intact

Posteromedial: Intact

Anterior: Intact

Achilles: Mild thickening in areas of slight increased signal
intensity suggesting chronic tendinopathy. Possible prior surgery.

Plantar Fascia: Intact

LIGAMENTS

Lateral: Intact

Medial: Intact

CARTILAGE

Ankle Joint: Small ankle joint effusion. The articular cartilage is
intact. No cartilage defects or osteochondral lesion.

Subtalar Joints/Sinus Tarsi: Subtalar degenerative changes. There
are complex joint effusions with significant synovitis versus
dissecting complex ganglion cysts. This involves the posterior
talocalcaneal facet in the sinus tarsi. There is also involvement of
the talus with probable intraosseous ganglion.

Bones: Evidence of prior surgery involving the anterior aspect of
the calcaneus near the base of the anterior process. There is a
large defect with a piece of necrotic bone in the middle of it. This
may be a bone graft fragment. The cavity is filled with complex
fluid.

There is also surgical changes involving the middle cuneiform which
may be prior osteotomy or bone graft harvest site.

Other: The ankle and foot musculature grossly normal.
IMPRESSION: 1. Postoperative changes involving the anterior aspect of the
calcaneus with a large defect in the bone measuring at least 2 x 2
cm. This contains a necrotic appearing triangular bone fragment.
2. Complex subtalar and sinus tarsi fluid collection either severe
synovitis versus complex dissecting ganglion cyst. There is also an
intraosseous component in the talus.
3. Surgical defect involving the medial cuneiform possible osteotomy
or bone graft harvest site.
4. Intact medial and lateral ankle ligaments and tendons.
5. Marked thickening of the Achilles tendon, chronic tendinopathy
versus postoperative changes and possible lengthening procedure.

## 2020-09-25 DIAGNOSIS — L732 Hidradenitis suppurativa: Secondary | ICD-10-CM | POA: Diagnosis not present

## 2020-10-09 DIAGNOSIS — L732 Hidradenitis suppurativa: Secondary | ICD-10-CM | POA: Diagnosis not present

## 2021-05-30 DIAGNOSIS — Z0000 Encounter for general adult medical examination without abnormal findings: Secondary | ICD-10-CM | POA: Diagnosis not present

## 2021-05-30 DIAGNOSIS — G809 Cerebral palsy, unspecified: Secondary | ICD-10-CM | POA: Diagnosis not present

## 2021-05-30 DIAGNOSIS — Z13220 Encounter for screening for lipoid disorders: Secondary | ICD-10-CM | POA: Diagnosis not present

## 2021-05-30 DIAGNOSIS — Z23 Encounter for immunization: Secondary | ICD-10-CM | POA: Diagnosis not present

## 2021-06-24 DIAGNOSIS — L732 Hidradenitis suppurativa: Secondary | ICD-10-CM | POA: Diagnosis not present

## 2022-04-02 ENCOUNTER — Ambulatory Visit (INDEPENDENT_AMBULATORY_CARE_PROVIDER_SITE_OTHER): Payer: BC Managed Care – PPO

## 2022-04-02 ENCOUNTER — Ambulatory Visit (INDEPENDENT_AMBULATORY_CARE_PROVIDER_SITE_OTHER): Payer: BC Managed Care – PPO | Admitting: Orthopaedic Surgery

## 2022-04-02 ENCOUNTER — Encounter: Payer: Self-pay | Admitting: Orthopaedic Surgery

## 2022-04-02 DIAGNOSIS — M25572 Pain in left ankle and joints of left foot: Secondary | ICD-10-CM

## 2022-04-02 DIAGNOSIS — G8929 Other chronic pain: Secondary | ICD-10-CM | POA: Diagnosis not present

## 2022-04-02 NOTE — Progress Notes (Signed)
Office Visit Note   Patient: Shawn Tucker           Date of Birth: 12-19-1995           MRN: [id number] Visit Date: 04/02/2022              Requested by: No referring provider defined for this encounter. PCP: Patient, No Pcp Per   Assessment & Plan: Visit Diagnoses:  1. Pain in left ankle and joints of left foot   2. Chronic pain of left ankle     Plan: Chronic left ankle pain with a recent exacerbation.  No history of injury or trauma.  Patient has history of cerebral palsy and uses bilateral Canadian crutches.  Has had prior surgery at [facility] for heel cord lengthening and midfoot osteotomies.  Most of his tenderness is localized over the lateral malleolus.  Skin was intact and there was no swelling.  Also was some tenderness over the anterior talofibular ligament.  X-rays were negative for any acute changes.  Might have a chronic strain of his ankle and will try an ASO ankle support.  Follow-up with Dr. Lajoyce Corners should he continue to have a problem.  He did have an MRI scan of that same ankle in 2020 demonstrating some cystic changes in the talus and os calcis and old bone grafting.  There also was a subtalar arthritic changes.  Not had having any symptoms referable to those areas today.  In addition to the lidocaine patches mother might try Voltaren gel  Follow-Up Instructions: Return if symptoms worsen or fail to improve.   Orders:  Orders Placed This Encounter  Procedures   XR Ankle Complete Left   No orders of the defined types were placed in this encounter.     Procedures: No procedures performed   Clinical Data: No additional findings.   Subjective: Chief Complaint  Patient presents with   Left Ankle - Follow-up   Patient presents today with his mother for his left ankle pain. Patient has cerebral palsy and has been having increased pain in his left ankle. Patient was previous wearing anterior AFO braces however mom felt like braces were not helping the  patient, so he is no longer wearing the braces. Patient has had no injuries or falls since his last office visit 2 years ago. Bilateral lengthening surgery has been preformed on patient years ago however the left ankle has a graft. Mom has been applying lidocaine patch to the patient.  Review of Systems   Objective: Vital Signs: There were no vitals taken for this visit.  Physical Exam Constitutional:      Appearance: He is well-developed.  Eyes:     Pupils: Pupils are equal, round, and reactive to light.  Pulmonary:     Effort: Pulmonary effort is normal.  Skin:    General: Skin is warm and dry.  Neurological:     Mental Status: He is alert and oriented to person, place, and time.  Psychiatric:        Behavior: Behavior normal.     Ortho Exam awake alert and oriented x3.  Comfortable sitting and in no acute distress left ankle was not swollen.  Skin intact.  There was tenderness over the tip of the lateral malleolus and some pain along the anterior talofibular ligament.  No obvious instability.  No mid or hindfoot pain.  No Achilles discomfort.  Was able to dorsiflex and plantarflex the foot but limited inversion and eversion of the foot  probably based on prior surgery.  Good capillary refill to the toes.  No pain behind either malleoli.  Specialty Comments:  No specialty comments available.  Imaging: XR Ankle Complete Left  Result Date: 04/02/2022 Films of the left ankle obtained in several projections.  Patient is experiencing pain over the tip of the lateral malleolus.  X-rays were nondiagnostic without any obvious pathology.  There are cystic changes in the talus and calcaneus and evidence of old surgery with bone grafting there may be some irregularity of the subtalar joint consistent with some mild arthritis.  No acute changes    PMFS History: Patient Active Problem List   Diagnosis Date Noted   Chronic pain of left ankle 02/03/2019   Obesity 12/04/2017   Inguinal  lymphadenopathy - left groin 01/04/2016   Mass of left inguinal region 12/31/2015   Congenital diplegia (HCC) 07/29/2013   Attention deficit hyperactivity disorder (ADHD) 07/29/2013   Mild intellectual disability 07/29/2013   Past Medical History:  Diagnosis Date   Right axillary hidradenitis 05/2016   recurrent   Spastic diplegic cerebral palsy (HCC)     History reviewed. No pertinent family history.  Past Surgical History:  Procedure Laterality Date   AXILLARY HIDRADENITIS EXCISION Right 07/10/2015   EXCISION OF ACCESSORY NAVICULAR WITH ADVANCEMENT OF POSTERIOR TIBIAL TENDON Left 02/23/2014   foot   FOOT CAPSULE RELEASE W/ PERCUTANEOUS HEEL CORD LENGTHENING, TIBIAL TENDON TRANSFER Bilateral    HYDRADENITIS EXCISION Right 06/11/2016   Procedure: EXCISION RECURRENT  HIDRADENITIS RIGHT AXILLA;  Surgeon: Manus Rudd, MD;  Location: [facility];  Service: General;  Laterality: Right;   RHIZOTOMY  2003   Social History   Occupational History   Not on file  Tobacco Use   Smoking status: Never   Smokeless tobacco: Never  Substance and Sexual Activity   Alcohol use: No   Drug use: No   Sexual activity: Never

## 2022-07-08 DIAGNOSIS — Z13220 Encounter for screening for lipoid disorders: Secondary | ICD-10-CM | POA: Diagnosis not present

## 2022-07-08 DIAGNOSIS — Z0000 Encounter for general adult medical examination without abnormal findings: Secondary | ICD-10-CM | POA: Diagnosis not present

## 2022-07-08 DIAGNOSIS — Z23 Encounter for immunization: Secondary | ICD-10-CM | POA: Diagnosis not present

## 2022-07-08 DIAGNOSIS — G809 Cerebral palsy, unspecified: Secondary | ICD-10-CM | POA: Diagnosis not present

## 2023-04-30 DIAGNOSIS — L732 Hidradenitis suppurativa: Secondary | ICD-10-CM | POA: Diagnosis not present

## 2023-06-24 ENCOUNTER — Ambulatory Visit: Payer: Self-pay | Admitting: Surgery

## 2023-06-24 DIAGNOSIS — L732 Hidradenitis suppurativa: Secondary | ICD-10-CM | POA: Diagnosis not present

## 2023-06-24 NOTE — H&P (Signed)
Subjective  Chief Complaint: Wound Check ( I&D LEFT axillary abscess )    History of Present Illness: Shawn Tucker is a 27 y.o. male with hx excision right axillary hidradenitis in 2017 by Dr. Corliss Skains.  He was seen in Feb 2022 at which time abx were prescribed for a flare in his left axilla.  Our pA Puja saw him in October 2022 at which time he had a painful left axillary lump.  She was able to press on the area evacuating a few cc of fluid and prescribed him antibiotics.    His mother states the area flared up a few times afterwards but most recently about 6 weeks ago when he experienced some bloody drainage from the area.  The area is now firm and painful.  He has difficulty pushing carts at work.  He underwent bedside I&D of left axillary abscess in the lateral axilla on 04/30/23 releasing a small amount of purulent fluid.  The cavity was packed with gauze. The wound healed up, but he continues to have some occasional purulent drainage.  He also notices occasional drainage from the right axilla.  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   Review of Systems: A complete review of systems was obtained from the patient.  I have reviewed this information and discussed as appropriate with the patient.  See HPI as well for other ROS.  ROS  All other review of systems negative.   Medical History: Past Medical History: Diagnosis Date  Asthma without status asthmaticus (HHS-HCC)   resolved  Cerebral palsy (CMS/HHS-HCC)   mild- spastic dysplasia  Developmental delay   Elementary school level  Gait abnormality   uses bilateral AFOs and crutches  Immunizations up to date   Other elective surgery   Dorsal rhizotomy   Patient Active Problem List Diagnosis  CP (cerebral palsy) (CMS/HHS-HCC)  Exostosis  Axillary hidradenitis suppurativa   Past Surgical History: Procedure Laterality Date  ADVANCEMENT POSTERIOR TIBIAL TENDON W/EXCISION ACCESSORY  NAVICULAR Left 02/22/2014  Procedure: ADVANCEMENT POSTERIOR TIBIAL TENDON W/EXCISION ACCESSORY NAVICULAR;  Surgeon: Corwin Levins, MD;  Location: [facility];  Service: Orthopedics;  Laterality: Left;  EXCISION BONE CYST/TUMOR FOOT Left 02/22/2014  Procedure: EXCISION BONE CYST/TUMOR FOOT;  Surgeon: Corwin Levins, MD;  Location: [facility];  Service: Orthopedics;  Laterality: Left;  ACHILLES TENDON LENGTHENING Bilateral     No Known Allergies  Current Outpatient Medications on File Prior to Visit Medication Sig Dispense Refill  AMPHETAMINE-DEXTROAMPHETAMINE 20 MG tablet Take 20 mg by mouth every morning  0  cholecalciferol (VITAMIN D3) 1000 unit tablet Take by mouth once daily. Patient takes 5000 units daily (Patient not taking: Reported on 06/24/2023)    oxyCODONE (ROXICODONE) 5 MG immediate release tablet Take one to two tablets every 3-4 hours as needed for pain. (Patient not taking: Reported on 06/24/2023) 60 tablet 0  No current facility-administered medications on file prior to visit.   Family History Problem Relation Age of Onset  High blood pressure (Hypertension) Mother   Diabetes Maternal Grandmother   Anesthesia problems Neg Hx     Social History  Tobacco Use Smoking Status Never Smokeless Tobacco Never    Social History  Socioeconomic History  Marital status: Single Tobacco Use  Smoking status: Never  Smokeless tobacco: Never Vaping Use  Vaping status: Never Used Substance and Sexual Activity  Alcohol use: No  Drug use: No Social History Narrative  Lives with both parents and 38 year old  sister;currently in 11 th grade   Objective:    General: Well-developed, well-nourished, in no acute distress.  Wearing mask Eyes: No scleral icterus.  Pupils equal, lids normal Respiratory: Normal effort, no use of accessory muscles Musculoskeletal: Normal gait. Grossly normal ROM upper and lower extremities  Psychiatric: Normal judgement and insight.  Normal mood and  affect.  Alert, oriented x 3  Left axilla: In the lateral axillary crease, the I&D site is almost completely healed.  There is a pinpoint opening with some purulent drainage when palpated.  Right axilla - crusted wound with no active fluctuance or drainage; linear 3 x 1 cm area of firmness across the center of the axilla  Assessment and Plan:  Diagnoses and all orders for this visit:  Axillary hidradenitis suppurativa   He seems to have mild recurrences in both axillae.  We discussed the situation with the patient and his mother.  Would recommend excision of the draining tracts in both axillae to prevent spreading infections.  At this time, the infections seem to be fairly well localized.    He is in agreement with this plan.  Excision of bilateral axillary hidradenitis.  The surgical procedure has been discussed with the patient.  Potential risks, benefits, alternative treatments, and expected outcomes have been explained.  All of the patient's questions at this time have been answered.  The likelihood of reaching the patient's treatment goal is good.  The patient understands the proposed surgical procedure and wishes to proceed.  Wilmon Arms. Corliss Skains, MD, [facility], [facility] General Surgery  06/24/2023 9:17 PM

## 2023-07-20 DIAGNOSIS — Z0000 Encounter for general adult medical examination without abnormal findings: Secondary | ICD-10-CM | POA: Diagnosis not present

## 2023-07-20 DIAGNOSIS — Z23 Encounter for immunization: Secondary | ICD-10-CM | POA: Diagnosis not present

## 2023-07-20 DIAGNOSIS — G809 Cerebral palsy, unspecified: Secondary | ICD-10-CM | POA: Diagnosis not present

## 2023-07-20 DIAGNOSIS — F909 Attention-deficit hyperactivity disorder, unspecified type: Secondary | ICD-10-CM | POA: Diagnosis not present

## 2023-07-23 ENCOUNTER — Other Ambulatory Visit: Payer: Self-pay

## 2023-07-23 ENCOUNTER — Encounter (HOSPITAL_BASED_OUTPATIENT_CLINIC_OR_DEPARTMENT_OTHER): Payer: Self-pay | Admitting: Surgery

## 2023-07-30 ENCOUNTER — Encounter (HOSPITAL_BASED_OUTPATIENT_CLINIC_OR_DEPARTMENT_OTHER)
Admission: RE | Disposition: A | Discharge status: Home / Self Care | Payer: Self-pay | Source: Home / Self Care | Attending: Surgery

## 2023-07-30 ENCOUNTER — Encounter (HOSPITAL_BASED_OUTPATIENT_CLINIC_OR_DEPARTMENT_OTHER): Payer: Self-pay | Admitting: Surgery

## 2023-07-30 ENCOUNTER — Other Ambulatory Visit: Payer: Self-pay

## 2023-07-30 ENCOUNTER — Ambulatory Visit (HOSPITAL_BASED_OUTPATIENT_CLINIC_OR_DEPARTMENT_OTHER)
Admission: RE | Admit: 2023-07-30 | Discharge: 2023-07-30 | Disposition: A | Discharge status: Home / Self Care | Payer: BC Managed Care – PPO | Attending: Surgery | Admitting: Surgery

## 2023-07-30 ENCOUNTER — Ambulatory Visit (HOSPITAL_BASED_OUTPATIENT_CLINIC_OR_DEPARTMENT_OTHER): Payer: BC Managed Care – PPO | Admitting: Anesthesiology

## 2023-07-30 DIAGNOSIS — L732 Hidradenitis suppurativa: Secondary | ICD-10-CM | POA: Insufficient documentation

## 2023-07-30 DIAGNOSIS — G809 Cerebral palsy, unspecified: Secondary | ICD-10-CM | POA: Insufficient documentation

## 2023-07-30 DIAGNOSIS — E669 Obesity, unspecified: Secondary | ICD-10-CM | POA: Insufficient documentation

## 2023-07-30 DIAGNOSIS — Z6838 Body mass index (BMI) 38.0-38.9, adult: Secondary | ICD-10-CM | POA: Diagnosis not present

## 2023-07-30 HISTORY — DX: Unspecified asthma, uncomplicated: J45.909

## 2023-07-30 HISTORY — PX: HYDRADENITIS EXCISION: SHX5243

## 2023-07-30 SURGERY — EXCISION, HIDRADENITIS, AXILLA
Anesthesia: General | Site: Axilla | Laterality: Bilateral

## 2023-07-30 MED ORDER — FENTANYL CITRATE (PF) 100 MCG/2ML IJ SOLN
INTRAMUSCULAR | Status: AC
  Filled 2023-07-30: qty 2

## 2023-07-30 MED ORDER — OXYCODONE HCL 5 MG PO TABS
ORAL_TABLET | ORAL | Status: AC
  Filled 2023-07-30: qty 1

## 2023-07-30 MED ORDER — CEFAZOLIN SODIUM-DEXTROSE 2-4 GM/100ML-% IV SOLN
2.0000 g | INTRAVENOUS | Status: AC
  Administered 2023-07-30: 2 g via INTRAVENOUS

## 2023-07-30 MED ORDER — 0.9 % SODIUM CHLORIDE (POUR BTL) OPTIME
TOPICAL | Status: DC | PRN
  Administered 2023-07-30: 400 mL

## 2023-07-30 MED ORDER — ACETAMINOPHEN 500 MG PO TABS
ORAL_TABLET | ORAL | Status: AC
  Filled 2023-07-30: qty 2

## 2023-07-30 MED ORDER — HYDROCODONE-ACETAMINOPHEN 5-325 MG PO TABS: 1.0000 | ORAL_TABLET | Freq: Four times a day (QID) | ORAL | 0 refills | Status: AC | PRN

## 2023-07-30 MED ORDER — ONDANSETRON HCL 4 MG/2ML IJ SOLN
INTRAMUSCULAR | Status: DC | PRN
  Administered 2023-07-30: 4 mg via INTRAVENOUS

## 2023-07-30 MED ORDER — ACETAMINOPHEN 500 MG PO TABS
1000.0000 mg | ORAL_TABLET | ORAL | Status: AC
  Administered 2023-07-30: 1000 mg via ORAL

## 2023-07-30 MED ORDER — DEXAMETHASONE SODIUM PHOSPHATE 10 MG/ML IJ SOLN
INTRAMUSCULAR | Status: DC | PRN
  Administered 2023-07-30: 10 mg via INTRAVENOUS

## 2023-07-30 MED ORDER — CHLORHEXIDINE GLUCONATE CLOTH 2 % EX PADS: 6.0000 | MEDICATED_PAD | Freq: Once | CUTANEOUS | Status: DC

## 2023-07-30 MED ORDER — DEXAMETHASONE SODIUM PHOSPHATE 10 MG/ML IJ SOLN
INTRAMUSCULAR | Status: AC
  Filled 2023-07-30: qty 1

## 2023-07-30 MED ORDER — LACTATED RINGERS IV SOLN: INTRAVENOUS | Status: DC

## 2023-07-30 MED ORDER — LIDOCAINE HCL (CARDIAC) PF 100 MG/5ML IV SOSY
PREFILLED_SYRINGE | INTRAVENOUS | Status: DC | PRN
  Administered 2023-07-30: 100 mg via INTRAVENOUS

## 2023-07-30 MED ORDER — MIDAZOLAM HCL 2 MG/2ML IJ SOLN
INTRAMUSCULAR | Status: AC
  Filled 2023-07-30: qty 2

## 2023-07-30 MED ORDER — MIDAZOLAM HCL 5 MG/5ML IJ SOLN
INTRAMUSCULAR | Status: DC | PRN
  Administered 2023-07-30: 2 mg via INTRAVENOUS

## 2023-07-30 MED ORDER — FENTANYL CITRATE (PF) 100 MCG/2ML IJ SOLN
INTRAMUSCULAR | Status: DC | PRN
  Administered 2023-07-30 (×3): 50 ug via INTRAVENOUS

## 2023-07-30 MED ORDER — FENTANYL CITRATE (PF) 100 MCG/2ML IJ SOLN
25.0000 ug | INTRAMUSCULAR | Status: DC | PRN
  Administered 2023-07-30: 25 ug via INTRAVENOUS

## 2023-07-30 MED ORDER — PROPOFOL 10 MG/ML IV BOLUS
INTRAVENOUS | Status: DC | PRN
  Administered 2023-07-30: 200 mg via INTRAVENOUS
  Administered 2023-07-30: 50 mg via INTRAVENOUS

## 2023-07-30 MED ORDER — LIDOCAINE 2% (20 MG/ML) 5 ML SYRINGE: INTRAMUSCULAR | Status: DC | PRN

## 2023-07-30 MED ORDER — OXYCODONE HCL 5 MG PO TABS
5.0000 mg | ORAL_TABLET | Freq: Once | ORAL | Status: AC
  Administered 2023-07-30: 5 mg via ORAL

## 2023-07-30 MED ORDER — PROPOFOL 10 MG/ML IV BOLUS
INTRAVENOUS | Status: AC
  Filled 2023-07-30: qty 20

## 2023-07-30 MED ORDER — CEFAZOLIN SODIUM-DEXTROSE 2-4 GM/100ML-% IV SOLN
INTRAVENOUS | Status: AC
  Filled 2023-07-30: qty 100

## 2023-07-30 MED ORDER — ONDANSETRON HCL 4 MG/2ML IJ SOLN
INTRAMUSCULAR | Status: AC
  Filled 2023-07-30: qty 2

## 2023-07-30 MED ORDER — BUPIVACAINE-EPINEPHRINE 0.25% -1:200000 IJ SOLN
INTRAMUSCULAR | Status: DC | PRN
  Administered 2023-07-30: 20 mL

## 2023-07-30 SURGICAL SUPPLY — 36 items
BENZOIN TINCTURE PRP APPL 2/3 (GAUZE/BANDAGES/DRESSINGS) IMPLANT
BLADE SURG 15 STRL LF DISP TIS (BLADE) ×1 IMPLANT
CANISTER SUCT 1200ML W/VALVE (MISCELLANEOUS) ×1 IMPLANT
CHLORAPREP W/TINT 26 (MISCELLANEOUS) ×1 IMPLANT
COVER BACK TABLE 60X90IN (DRAPES) ×1 IMPLANT
COVER MAYO STAND STRL (DRAPES) ×1 IMPLANT
DERMABOND ADVANCED .7 DNX12 (GAUZE/BANDAGES/DRESSINGS) IMPLANT
DRAPE TOP ARMCOVERS (MISCELLANEOUS) IMPLANT
DRAPE U-SHAPE 76X120 STRL (DRAPES) IMPLANT
DRAPE UTILITY XL STRL (DRAPES) ×1 IMPLANT
DRSG TEGADERM 4X4.75 (GAUZE/BANDAGES/DRESSINGS) IMPLANT
ELECT REM PT RETURN 9FT ADLT (ELECTROSURGICAL) ×1
ELECTRODE REM PT RTRN 9FT ADLT (ELECTROSURGICAL) ×1 IMPLANT
GAUZE SPONGE 4X4 12PLY STRL LF (GAUZE/BANDAGES/DRESSINGS) IMPLANT
GAUZE XEROFORM 1X8 LF (GAUZE/BANDAGES/DRESSINGS) IMPLANT
GLOVE BIO SURGEON STRL SZ7 (GLOVE) ×1 IMPLANT
GLOVE BIOGEL PI IND STRL 7.5 (GLOVE) ×1 IMPLANT
GOWN STRL REUS W/ TWL LRG LVL3 (GOWN DISPOSABLE) ×2 IMPLANT
NDL HYPO 25X1 1.5 SAFETY (NEEDLE) ×1 IMPLANT
NEEDLE HYPO 25X1 1.5 SAFETY (NEEDLE) ×1
NS IRRIG 1000ML POUR BTL (IV SOLUTION) IMPLANT
PACK BASIN DAY SURGERY FS (CUSTOM PROCEDURE TRAY) ×1 IMPLANT
PENCIL SMOKE EVACUATOR (MISCELLANEOUS) ×1 IMPLANT
SLEEVE SCD COMPRESS KNEE MED (STOCKING) ×1 IMPLANT
SPIKE FLUID TRANSFER (MISCELLANEOUS) ×1 IMPLANT
SPONGE T-LAP 4X18 ~~LOC~~+RFID (SPONGE) ×1 IMPLANT
STRIP CLOSURE SKIN 1/2X4 (GAUZE/BANDAGES/DRESSINGS) IMPLANT
SUT ETHILON 3 0 PS 1 (SUTURE) IMPLANT
SUT MON AB 4-0 PC3 18 (SUTURE) IMPLANT
SUT VIC AB 3-0 SH 27X BRD (SUTURE) ×1 IMPLANT
SYR BULB EAR ULCER 3OZ GRN STR (SYRINGE) ×1 IMPLANT
SYR CONTROL 10ML LL (SYRINGE) ×1 IMPLANT
TOWEL GREEN STERILE FF (TOWEL DISPOSABLE) ×1 IMPLANT
TRAY DSU PREP LF (CUSTOM PROCEDURE TRAY) IMPLANT
TUBE CONNECTING 20X1/4 (TUBING) ×1 IMPLANT
YANKAUER SUCT BULB TIP NO VENT (SUCTIONS) ×1 IMPLANT

## 2023-07-30 NOTE — Discharge Instructions (Addendum)
[facility] Office Phone Number [phone] POST OP INSTRUCTIONS  Always review your discharge instruction sheet given to you by the facility where your surgery was performed.  IF YOU HAVE DISABILITY OR FAMILY LEAVE FORMS, YOU MUST BRING THEM TO THE OFFICE FOR PROCESSING.  DO NOT GIVE THEM TO YOUR DOCTOR.  A prescription for pain medication may be given to you upon discharge.  Take your pain medication as prescribed, if needed.  If narcotic pain medicine is not needed, then you may take acetaminophen (Tylenol) or ibuprofen (Advil) as needed. Take your usually prescribed medications unless otherwise directed If you need a refill on your pain medication, please contact your pharmacy.  They will contact our office to request authorization.  Prescriptions will not be filled after 5pm or on week-ends. You should eat very light the first 24 hours after surgery, such as soup, crackers, pudding, etc.  Resume your normal diet the day after surgery. Most patients will experience some swelling and bruising around the surgical site.  Ice packs will help.  Swelling and bruising can take several days to resolve.  It is common to experience some constipation if taking pain medication after surgery.  Increasing fluid intake and taking a stool softener will usually help or prevent this problem from occurring.  A mild laxative (Milk of Magnesia or Miralax) should be taken according to package directions if there are no bowel movements after 48 hours. You may begin showering 24 hours after surgery.  Do not scrub the incisions but water may run over the incisions.  You have skin glue which will flake off over the next week. ACTIVITIES:  You may resume regular daily activities (gradually increasing) beginning the next day.   You may have sexual intercourse when it is comfortable. You may drive when you no longer are taking prescription pain medication, you can comfortably wear a seatbelt, and you can  safely maneuver your car and apply brakes. RETURN TO WORK:  1-2 weeks You should see your doctor in the office for a follow-up appointment approximately two to three weeks after your surgery.    WHEN TO CALL YOUR DOCTOR: Fever over 101.0 Nausea and/or vomiting. Extreme swelling or bruising. Continued bleeding from incision. Increased pain, redness, or drainage from the incision.  The clinic staff is available to answer your questions during regular business hours.  Please don't hesitate to call and ask to speak to one of the nurses for clinical concerns.  If you have a medical emergency, go to the nearest emergency room or call 911.  A surgeon from [facility] is always on call at the hospital.  For further questions, please visit [email or web address]    Post Anesthesia Home Care Instructions  Activity: Get plenty of rest for the remainder of the day. A responsible individual must stay with you for 24 hours following the procedure.  For the next 24 hours, DO NOT: -Drive a car -Advertising copywriter -Drink alcoholic beverages -Take any medication unless instructed by your physician -Make any legal decisions or sign important papers.  Meals: Start with liquid foods such as gelatin or soup. Progress to regular foods as tolerated. Avoid greasy, spicy, heavy foods. If nausea and/or vomiting occur, drink only clear liquids until the nausea and/or vomiting subsides. Call your physician if vomiting continues.  Special Instructions/Symptoms: Your throat may feel dry or sore from the anesthesia or the breathing tube placed in your throat during surgery. If this causes discomfort, gargle with warm salt water. The  discomfort should disappear within 24 hours.  May have Tylenol after 7pm if needed.

## 2023-07-30 NOTE — Anesthesia Preprocedure Evaluation (Addendum)
Anesthesia Evaluation  Patient identified by MRN, date of birth, ID band Patient awake    Reviewed: Allergy & Precautions, NPO status , Patient's Chart, lab work & pertinent test results  Airway Mallampati: III  TM Distance: >3 FB Neck ROM: Full    Dental no notable dental hx. (+) Teeth Intact, Dental Advisory Given   Pulmonary asthma    Pulmonary exam normal breath sounds clear to auscultation       Cardiovascular negative cardio ROS Normal cardiovascular exam Rhythm:Regular Rate:Normal     Neuro/Psych Cerebral palsy negative neurological ROS  negative psych ROS   GI/Hepatic negative GI ROS, Neg liver ROS,,,  Endo/Other  Obese BMI 38  Renal/GU negative Renal ROS  negative genitourinary   Musculoskeletal negative musculoskeletal ROS (+)    Abdominal   Peds  (+) ADHD Hematology negative hematology ROS (+)   Anesthesia Other Findings   Reproductive/Obstetrics                             Anesthesia Physical Anesthesia Plan  ASA: 2  Anesthesia Plan: General   Post-op Pain Management: Tylenol PO (pre-op)*   Induction: Intravenous  PONV Risk Score and Plan: 2 and Ondansetron, Dexamethasone and Midazolam  Airway Management Planned: LMA  Additional Equipment:   Intra-op Plan:   Post-operative Plan: Extubation in OR  Informed Consent: I have reviewed the patients History and Physical, chart, labs and discussed the procedure including the risks, benefits and alternatives for the proposed anesthesia with the patient or authorized representative who has indicated his/her understanding and acceptance.     Dental advisory given  Plan Discussed with: CRNA  Anesthesia Plan Comments:        Anesthesia Quick Evaluation

## 2023-07-30 NOTE — Anesthesia Procedure Notes (Signed)
Procedure Name: LMA Insertion Date/Time: 07/30/2023 1:03 PM  Performed by: Thornell Mule, CRNAPre-anesthesia Checklist: Patient identified, Emergency Drugs available, Suction available and Patient being monitored Patient Re-evaluated:Patient Re-evaluated prior to induction Oxygen Delivery Method: Circle system utilized Preoxygenation: Pre-oxygenation with 100% oxygen Induction Type: IV induction LMA: LMA inserted LMA Size: 4.0 Number of attempts: 1 Placement Confirmation: positive ETCO2 Tube secured with: Tape Dental Injury: Teeth and Oropharynx as per pre-operative assessment

## 2023-07-30 NOTE — Op Note (Signed)
Pre-op diagnosis: Bilateral axillary hidradenitis Postop diagnosis: Same Procedure performed: Excision of bilateral axillary hidradenitis Surgeon:Marypat Kimmet K Shaneece Stockburger Anesthesia: General Indications:  Shawn Tucker is a 27 y.o. male with hx excision right axillary hidradenitis in 2017 by Dr. Corliss Skains.  He was seen in Feb 2022 at which time abx were prescribed for a flare in his left axilla.   Our PA Puja saw him in October 2022 at which time he had a painful left axillary lump.  She was able to press on the area evacuating a few cc of fluid and prescribed him antibiotics.     His mother states the area flared up a few times afterwards but most recently about 6 weeks ago when he experienced some bloody drainage from the area.  The area is now firm and painful.  He has difficulty pushing carts at work.   He underwent bedside I&D of left axillary abscess in the lateral axilla on 04/30/23 releasing a small amount of purulent fluid.  The cavity was packed with gauze. The wound healed up, but he continues to have some occasional purulent drainage.  He also notices occasional drainage from the right axilla.  He presents today for excision of both axilla.  The patient is brought to the operating room placed in the supine position on the operating room table.  After an adequate level general anesthesia was obtained, both axillary regions were shaved, prepped with Betadine and draped sterile fashion.  A timeout was taken to ensure the proper patient and proper procedure.  The patient has a small opening in the right axilla with some persistent drainage.  There is a linear area of scarring.  I made an elliptical incision around this area for a distance of 5 cm x 2 cm.  We excised the skin, subcutaneous tissue down to normal-appearing adipose tissue.  We excised this entire area and sent it for pathology.  We irrigated the wound thoroughly and inspected for hemostasis.  No further areas of inflammation or drainage were  noted.  We closed the wound with a deep layer of 3-0 Vicryl.  4-0 Monocryl was used to close the skin.  We turned our attention to the left axilla.  There are no open areas but there is some firm scar tissue and induration.  I made a elliptical incision 3 x 1 cm.  We excised the skin and subcutaneous tissue down to normal-appearing adipose tissue.  We irrigated and inspected for hemostasis.  The wound was closed with 3-0 Vicryl for Monocryl.  Both incisions were then sealed with Dermabond.  The patient is extubated and brought to the recovery room in stable condition.  All sponge, instrument, and needle counts are correct.  Wilmon Arms. Corliss Skains, MD, [facility]  General Surgery   07/30/2023 2:11 PM

## 2023-07-30 NOTE — Anesthesia Postprocedure Evaluation (Signed)
Anesthesia Post Note  Patient: Shawn Tucker  Procedure(s) Performed: EXCISION OF BILATERAL AXILLARY HIDRADENITIS (Bilateral: Axilla)     Patient location during evaluation: PACU Anesthesia Type: General Level of consciousness: awake Pain management: pain level controlled Vital Signs Assessment: post-procedure vital signs reviewed and stable Respiratory status: spontaneous breathing, nonlabored ventilation and respiratory function stable Cardiovascular status: blood pressure returned to baseline and stable Postop Assessment: no apparent nausea or vomiting Anesthetic complications: no   No notable events documented.  Last Vitals:  Vitals:   07/30/23 1415 07/30/23 1430  BP: (!) 150/87 (!) 140/83  Pulse: 88 73  Resp: 20 16  Temp: (!) 36.3 C   SpO2: 97% 100%    Last Pain:  Vitals:   07/30/23 1430  TempSrc:   PainSc: 2                  Linton Rump

## 2023-07-30 NOTE — H&P (Signed)
Subjective   Chief Complaint: Wound Check ( I&D LEFT axillary abscess )     History of Present Illness: Shawn Tucker is a 27 y.o. male with hx excision right axillary hidradenitis in 2017 by Dr. Corliss Skains.  He was seen in Feb 2022 at which time abx were prescribed for a flare in his left axilla.   Our pA Puja saw him in October 2022 at which time he had a painful left axillary lump.  She was able to press on the area evacuating a few cc of fluid and prescribed him antibiotics.     His mother states the area flared up a few times afterwards but most recently about 6 weeks ago when he experienced some bloody drainage from the area.  The area is now firm and painful.  He has difficulty pushing carts at work.   He underwent bedside I&D of left axillary abscess in the lateral axilla on 04/30/23 releasing a small amount of purulent fluid.  The cavity was packed with gauze. The wound healed up, but he continues to have some occasional purulent drainage.  He also notices occasional drainage from the right axilla.   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     Review of Systems: A complete review of systems was obtained from the patient.  I have reviewed this information and discussed as appropriate with the patient.  See HPI as well for other ROS.   ROS  All other review of systems negative.     Medical History: Past Medical History: DiagnosisDate            Asthma without status asthmaticus (HHS-HCC)                     resolved            Cerebral palsy (CMS/HHS-HCC)                    mild- spastic dysplasia            Developmental delay               Elementary school level            Gait abnormality                       uses bilateral AFOs and crutches            Immunizations up to date                   Other elective surgery              Dorsal rhizotomy     Patient Active Problem List Diagnosis CP (cerebral palsy)  (CMS/HHS-HCC) Exostosis Axillary hidradenitis suppurativa     Past Surgical History: ProcedureLateralityDate ADVANCEMENT POSTERIOR TIBIAL TENDON W/EXCISION ACCESSORY NAVICULARLeft6/17/2015             Procedure: ADVANCEMENT POSTERIOR TIBIAL TENDON W/EXCISION ACCESSORY NAVICULAR;  Surgeon: Corwin Levins, MD;  Location: [facility] OR;  Service: Orthopedics;  Laterality: Left; EXCISION BONE CYST/TUMOR FOOTLeft6/17/2015             Procedure: EXCISION BONE CYST/TUMOR FOOT;  Surgeon: Corwin Levins, MD;  Location: [facility] OR;  Service: Orthopedics;  Laterality: Left;            ACHILLES TENDON LENGTHENING           Bilateral  No Known Allergies   Current Outpatient Medications on File Prior to Visit MedicationSigDispenseRefill            AMPHETAMINE-DEXTROAMPHETAMINE 20 MG tablet    Take 20 mg by mouth every morning                        0            cholecalciferol (VITAMIN D3) 1000 unit tablet           Take by mouth once daily. Patient takes 5000 units daily (Patient not taking: Reported on 06/24/2023)                       oxyCODONE (ROXICODONE) 5 MG immediate release tabletTake one to two tablets every 3-4 hours as needed for pain. (Patient not taking: Reported on 06/24/2023)60 tablet0   No current facility-administered medications on file prior to visit.     Family History ProblemRelationAge of Onset            High blood pressure (Hypertension)   Mother             Diabetes          Maternal Grandmother                       Anesthesia problems  Neg Hx                 Social History   Tobacco Use Smoking StatusNever Smokeless TobaccoNever     Social History   Socioeconomic History Marital status:Single Tobacco Use Smoking status:Never Smokeless tobacco:Never Vaping Use Vaping status:Never Used Substance and Sexual Activity Alcohol use:No Drug use:No Social History Narrative             Lives with both parents and 68 year old sister;currently in 12 th grade      Objective:       General: Well-developed, well-nourished, in no acute distress.  Wearing mask Eyes: No scleral icterus.  Pupils equal, lids normal Respiratory: Normal effort, no use of accessory muscles Musculoskeletal: Normal gait. Grossly normal ROM upper and lower extremities  Psychiatric: Normal judgement and insight.  Normal mood and affect.  Alert, oriented x 3   Left axilla: In the lateral axillary crease, the I&D site is almost completely healed.  There is a pinpoint opening with some purulent drainage when palpated.   Right axilla - crusted wound with no active fluctuance or drainage; linear 3 x 1 cm area of firmness across the center of the axilla   Assessment and Plan:   Diagnoses and all orders for this visit:   Axillary hidradenitis suppurativa     He seems to have mild recurrences in both axillae.  We discussed the situation with the patient and his mother.  Would recommend excision of the draining tracts in both axillae to prevent spreading infections.  At this time, the infections seem to be fairly well localized.     He is in agreement with this plan.   Excision of bilateral axillary hidradenitis.  The surgical procedure has been discussed with the patient.  Potential risks, benefits, alternative treatments, and expected outcomes have been explained.  All of the patient's questions at this time have been answered.  The likelihood of reaching the patient's treatment goal is good.  The patient understands the proposed surgical procedure and wishes to proceed.

## 2023-07-30 NOTE — Transfer of Care (Signed)
Immediate Anesthesia Transfer of Care Note  Patient: Shawn Tucker  Procedure(s) Performed: EXCISION OF BILATERAL AXILLARY HIDRADENITIS (Bilateral: Axilla)  Patient Location: PACU  Anesthesia Type:General  Level of Consciousness: drowsy, patient cooperative, and responds to stimulation  Airway & Oxygen Therapy: Patient Spontanous Breathing and Patient connected to face mask oxygen  Post-op Assessment: Report given to RN and Post -op Vital signs reviewed and stable  Post vital signs: Reviewed and stable  Last Vitals:  Vitals Value Taken Time  BP    Temp    Pulse 87 07/30/23 1416  Resp 20 07/30/23 1416  SpO2 99 % 07/30/23 1416  Vitals shown include unfiled device data.  Last Pain:  Vitals:   07/30/23 1054  TempSrc: Axillary  PainSc: 0-No pain         Complications: No notable events documented.

## 2023-08-03 ENCOUNTER — Encounter (HOSPITAL_BASED_OUTPATIENT_CLINIC_OR_DEPARTMENT_OTHER): Payer: Self-pay | Admitting: Surgery

## 2023-08-03 LAB — SURGICAL PATHOLOGY

## 2023-12-23 ENCOUNTER — Ambulatory Visit: Admitting: Orthopedic Surgery

## 2023-12-23 ENCOUNTER — Encounter (HOSPITAL_COMMUNITY): Payer: Self-pay | Admitting: Emergency Medicine

## 2023-12-23 ENCOUNTER — Emergency Department (HOSPITAL_COMMUNITY): Payer: MEDICAID

## 2023-12-23 ENCOUNTER — Emergency Department (HOSPITAL_COMMUNITY)
Admission: EM | Admit: 2023-12-23 | Discharge: 2023-12-23 | Disposition: A | Discharge status: Home / Self Care | Payer: MEDICAID

## 2023-12-23 DIAGNOSIS — M62830 Muscle spasm of back: Secondary | ICD-10-CM | POA: Insufficient documentation

## 2023-12-23 DIAGNOSIS — G801 Spastic diplegic cerebral palsy: Secondary | ICD-10-CM | POA: Diagnosis not present

## 2023-12-23 DIAGNOSIS — Z043 Encounter for examination and observation following other accident: Secondary | ICD-10-CM | POA: Diagnosis not present

## 2023-12-23 DIAGNOSIS — M62838 Other muscle spasm: Secondary | ICD-10-CM

## 2023-12-23 DIAGNOSIS — M545 Low back pain, unspecified: Secondary | ICD-10-CM | POA: Diagnosis present

## 2023-12-23 LAB — URINALYSIS, ROUTINE W REFLEX MICROSCOPIC
Bilirubin Urine: NEGATIVE
Glucose, UA: NEGATIVE mg/dL
Hgb urine dipstick: NEGATIVE
Ketones, ur: NEGATIVE mg/dL
Leukocytes,Ua: NEGATIVE
Nitrite: NEGATIVE
Protein, ur: NEGATIVE mg/dL
Specific Gravity, Urine: 1.014 (ref 1.005–1.030)
pH: 6 (ref 5.0–8.0)

## 2023-12-23 LAB — BASIC METABOLIC PANEL WITH GFR
Anion gap: 10 (ref 5–15)
BUN: 9 mg/dL (ref 6–20)
CO2: 24 mmol/L (ref 22–32)
Calcium: 9.2 mg/dL (ref 8.9–10.3)
Chloride: 105 mmol/L (ref 98–111)
Creatinine, Ser: 1.15 mg/dL (ref 0.61–1.24)
GFR, Estimated: >60 mL/min (ref >60)
Glucose, Bld: 90 mg/dL (ref 70–99)
Potassium: 3.7 mmol/L (ref 3.5–5.1)
Sodium: 139 mmol/L (ref 135–145)

## 2023-12-23 LAB — CBC WITH DIFFERENTIAL/PLATELET
Abs Immature Granulocytes: 0.04 10*3/uL (ref 0.00–0.07)
Basophils Absolute: 0 10*3/uL (ref 0.0–0.1)
Basophils Relative: 0 %
Eosinophils Absolute: 0 10*3/uL (ref 0.0–0.5)
Eosinophils Relative: 0 %
HCT: 43.5 % (ref 39.0–52.0)
Hemoglobin: 15.1 g/dL (ref 13.0–17.0)
Immature Granulocytes: 0 %
Lymphocytes Relative: 13 %
Lymphs Abs: 1.4 10*3/uL (ref 0.7–4.0)
MCH: 33 pg (ref 26.0–34.0)
MCHC: 34.7 g/dL (ref 30.0–36.0)
MCV: 95 fL (ref 80.0–100.0)
Monocytes Absolute: 0.4 10*3/uL (ref 0.1–1.0)
Monocytes Relative: 4 %
Neutro Abs: 8.4 10*3/uL — ABNORMAL HIGH (ref 1.7–7.7)
Neutrophils Relative %: 83 %
Platelets: 213 10*3/uL (ref 150–400)
RBC: 4.58 MIL/uL (ref 4.22–5.81)
RDW: 11.9 % (ref 11.5–15.5)
WBC: 10.3 10*3/uL (ref 4.0–10.5)
nRBC: 0 % (ref 0.0–0.2)

## 2023-12-23 MED ORDER — METHOCARBAMOL 500 MG PO TABS
1000.0000 mg | ORAL_TABLET | Freq: Once | ORAL | Status: AC
  Administered 2023-12-23: 1000 mg via ORAL
  Filled 2023-12-23: qty 2

## 2023-12-23 MED ORDER — NAPROXEN 500 MG PO TABS
500.0000 mg | ORAL_TABLET | Freq: Two times a day (BID) | ORAL | 0 refills | Status: AC
Start: 2023-12-23 — End: ?

## 2023-12-23 MED ORDER — LIDOCAINE 5 % EX PTCH
2.0000 | MEDICATED_PATCH | CUTANEOUS | Status: DC
  Administered 2023-12-23: 2 via TRANSDERMAL
  Filled 2023-12-23: qty 2

## 2023-12-23 MED ORDER — METHOCARBAMOL 500 MG PO TABS: 500.0000 mg | ORAL_TABLET | Freq: Two times a day (BID) | ORAL | 0 refills | Status: AC

## 2023-12-23 MED ORDER — NAPROXEN 250 MG PO TABS
500.0000 mg | ORAL_TABLET | Freq: Once | ORAL | Status: AC
  Administered 2023-12-23: 500 mg via ORAL
  Filled 2023-12-23: qty 2

## 2023-12-23 NOTE — ED Provider Notes (Signed)
  EMERGENCY DEPARTMENT AT [facility] Provider Note   CSN: [id number] Arrival date & time: 12/23/23  1518     History  Chief Complaint  Patient presents with   Shawn Tucker    Fabricio Endsley is a 28 y.o. male with past medical history of spastic diplegia cerebral palsy, ADHD, mild intellectual disability, obesity presents to emergency department via EMS for evaluation of back pain and lower extremity weakness since Sunday.  He reports that on Sunday, he started noticing his typical symptoms of back spasms causing him to feel weak in his legs and fall to his knees. No head injury, LOC, dizziness, CP, SHOB prior to fall. Since then, he has continued to have weakness in his legs. Mother at bedside reports that he typically has these muscle spasms secondary to his CP.  Typically, he uses crutches when he has these muscle spasms and they go away with Tylenol , ibuprofen however the weakness or spasms do not typically last this long causing them to seek ED evaluation today. Initially, patient's BP was noted to be elevated (170s/90s) with EMS and at arrival to ED however has since decreased to 140/80. He denies IVDU, malignancy, incontinence, saddle paresthesia, urinary symptoms, URI symptoms, HA, fevers at home.   Fall Pertinent negatives include no chest pain, no abdominal pain, no headaches and no shortness of breath.      Home Medications Prior to Admission medications   Medication Sig Start Date End Date Taking? Authorizing Provider  methocarbamol  (ROBAXIN ) 500 MG tablet Take 1 tablet (500 mg total) by mouth 2 (two) times daily. 12/23/23  Yes Royann Cords, PA  naproxen  (NAPROSYN ) 500 MG tablet Take 1 tablet (500 mg total) by mouth 2 (two) times daily. 12/23/23  Yes Royann Cords, PA  Cholecalciferol (VITAMIN D) 50 MCG (2000 UT) CAPS Take by mouth.    [provider]  HYDROcodone -acetaminophen  (NORCO/VICODIN) 5-325 MG tablet Take 1 tablet by mouth every 6 (six) hours as  needed for moderate pain (pain score 4-6). 07/30/23   Dareen Ebbing, MD      Allergies    Patient has no known allergies.    Review of Systems   Review of Systems  Constitutional:  Negative for chills, fatigue and fever.  Respiratory:  Negative for cough, chest tightness, shortness of breath and wheezing.   Cardiovascular:  Negative for chest pain and palpitations.  Gastrointestinal:  Negative for abdominal pain, constipation, diarrhea, nausea and vomiting.  Neurological:  Negative for dizziness, seizures, weakness, light-headedness, numbness and headaches.    Physical Exam Updated Vital Signs BP (!) 145/74 (BP Location: Left Arm)   Pulse 69   Temp 98 F (36.7 C) (Oral)   Resp 16   SpO2 100%  Physical Exam Vitals and nursing note reviewed.  Constitutional:      General: He is not in acute distress.    Appearance: Normal appearance.  HENT:     Head: Normocephalic and atraumatic.  Eyes:     General: No visual field deficit.    Conjunctiva/sclera: Conjunctivae normal.  Cardiovascular:     Rate and Rhythm: Normal rate.  Pulmonary:     Effort: Pulmonary effort is normal. No respiratory distress.  Musculoskeletal:     Cervical back: No tenderness or bony tenderness. No pain with movement.     Thoracic back: Tenderness (paraspinous) and bony tenderness present.     Lumbar back: Tenderness (paraspinous) and bony tenderness present.     Right lower leg: No edema.  Left lower leg: No edema.     Comments: No swelling nor tenderness to BLE.  Celine Collard' sign negative  Skin:    General: Skin is warm.     Capillary Refill: Capillary refill takes less than 2 seconds.     Coloration: Skin is not jaundiced or pale.  Neurological:     Mental Status: He is alert and oriented to person, place, and time. Mental status is at baseline.     GCS: GCS eye subscore is 4. GCS verbal subscore is 5. GCS motor subscore is 6.     Cranial Nerves: No cranial nerve deficit, dysarthria or facial  asymmetry.     Sensory: No sensory deficit.     Motor: No weakness, tremor, abnormal muscle tone, seizure activity or pronator drift.     Coordination: Finger-Nose-Finger Test normal.     Comments: Follows commands appropriately.  Motor 5/5 of bilateral ankle dorsiflexion. Unable to lift legs against gravity nor perform heel to shin test 2/2 pain/ muscle spasms bilaterally. Sensation 2/2 of BUE and BLE.     ED Results / Procedures / Treatments   Labs (all labs ordered are listed, but only abnormal results are displayed) Labs Reviewed  CBC WITH DIFFERENTIAL/PLATELET - Abnormal; Notable for the following components:      Result Value   Neutro Abs 8.4 (*)    All other components within normal limits  BASIC METABOLIC PANEL WITH GFR  URINALYSIS, ROUTINE W REFLEX MICROSCOPIC    EKG None  Radiology DG Thoracic Spine 2 View Result Date: 12/23/2023 CLINICAL DATA:  Fall on Sunday, tender to palpation. EXAM: LUMBAR SPINE - COMPLETE 4+ VIEW; THORACIC SPINE 2 VIEWS COMPARISON:  None Available. FINDINGS: Thoracic spine: Please note that T12 is included on the concurrent lumbar spine exam. The alignment is maintained. No evidence of acute fracture. Cervicothoracic junction is not well assessed due to overlapping osseous and soft tissue structures. Vertebral body heights are maintained. No significant disc space narrowing. Posterior elements appear intact. There is no paravertebral soft tissue abnormality. Lumbar spine: 5 non-rib-bearing lumbar vertebra. Normal lumbar alignment. No evidence of acute fracture. Vertebral body heights are normal. Posterior elements are intact. The disc spaces are preserved. No pars defects or focal bone abnormality. Sacroiliac joints are congruent where included IMPRESSION: No fracture or subluxation of the thoracic or lumbar spine. Electronically Signed   By: Chadwick Colonel M.D.   On: 12/23/2023 19:22   DG Lumbar Spine Complete Result Date: 12/23/2023 CLINICAL DATA:   Fall on Sunday, tender to palpation. EXAM: LUMBAR SPINE - COMPLETE 4+ VIEW; THORACIC SPINE 2 VIEWS COMPARISON:  None Available. FINDINGS: Thoracic spine: Please note that T12 is included on the concurrent lumbar spine exam. The alignment is maintained. No evidence of acute fracture. Cervicothoracic junction is not well assessed due to overlapping osseous and soft tissue structures. Vertebral body heights are maintained. No significant disc space narrowing. Posterior elements appear intact. There is no paravertebral soft tissue abnormality. Lumbar spine: 5 non-rib-bearing lumbar vertebra. Normal lumbar alignment. No evidence of acute fracture. Vertebral body heights are normal. Posterior elements are intact. The disc spaces are preserved. No pars defects or focal bone abnormality. Sacroiliac joints are congruent where included IMPRESSION: No fracture or subluxation of the thoracic or lumbar spine. Electronically Signed   By: Chadwick Colonel M.D.   On: 12/23/2023 19:22    Procedures Procedures    Medications Ordered in ED Medications  naproxen  (NAPROSYN ) tablet 500 mg (500 mg Oral Given 12/23/23 1651)  methocarbamol  (ROBAXIN ) tablet 1,000 mg (1,000 mg Oral Given 12/23/23 1651)    ED Course/ Medical Decision Making/ A&P                                 Medical Decision Making Amount and/or Complexity of Data Reviewed Labs: ordered. Radiology: ordered.  Risk Prescription drug management.   Patient presents to the ED for concern of back pain, bilateral leg weakness, this involves an extensive number of treatment options, and is a complaint that carries with it a high risk of complications and morbidity.  The differential diagnosis includes DDD, sciatica, muscle spasm, cerebral palsy sequelae, UTI, nephrolithiasis, electrolyte abnormality   Co morbidities that complicate the patient evaluation  CP   Additional history obtained:  Additional history obtained from [facility]    External records from outside source obtained and reviewed including triage RN note and mother at bedside   Lab Tests:  I Ordered, and personally interpreted labs.  The pertinent results include:   No leukocytosis nor anemia No electrolyte abnormality UA without infection nor hemoglobin   Imaging Studies ordered:  I ordered imaging studies including thoracic and lumbar x-rays I independently visualized and interpreted imaging which showed no fracture, obvious abnormalities I agree with the radiologist interpretation     Medicines ordered and prescription drug management:  I ordered medication including lidocaine  patches, naproxen , Robaxin  for pain, muscle spasm Reevaluation of the patient after these medicines showed that the patient improved I have reviewed the patients home medicines and have made adjustments as needed     Problem List / ED Course:  Muscle spasm Spastic diplegia cerebral palsy Motor and sensation intact of BLE. No weakness of BLE on my exam. Pain and tightness in lower back with lifting legs Labs unremarkable.  UA without hemoglobin nor infection.  Low suspicion for kidney stone as pain is bilateral, there is no hemoglobin in urine, no history, no urinary symptoms Low suspicion for emergent causes symptoms such as cauda equina as patient has no IVDU, malignancy, saddle paresthesia, incontinence X-rays without abnormalities Symptoms are likely secondary to spastic diplegia CP Provided Robaxin , naproxen , lidocaine  patches in ED and reassess.  Patient was able to move much more freely and able to lift legs as he was not prior and ambulate Discussed findings, reassessment with patient and patient's mother.  She has an appointment with Ortho next week.  I suggested that they keep that appointment as well as follow-up with PCP or whoever manages his CP.  She expresses understanding and agrees   Reevaluation:  After the interventions noted above, I  reevaluated the patient and found that they have :improved     Dispostion:  After consideration of the diagnostic results and the patients response to treatment, I feel that the patent would benefit from outpatient management and follow-up.   Discussed ED workup, disposition, return to ED precautions with patient who expresses understanding agrees with plan.  All questions answered to their satisfaction.  They are agreeable to plan.  Discharge instructions provided on paperwork  Final Clinical Impression(s) / ED Diagnoses Final diagnoses:  Muscle spasm  Spastic diplegic cerebral palsy (HCC)    Rx / DC Orders ED Discharge Orders          Ordered    naproxen  (NAPROSYN ) 500 MG tablet  2 times daily        12/23/23 2035    methocarbamol  (ROBAXIN ) 500 MG  tablet  2 times daily        12/23/23 2035              Royann Cords, PA 12/24/23 1422    Carin Charleston, MD 12/28/23 [phone]

## 2023-12-23 NOTE — ED Triage Notes (Signed)
 Pt here from home with c/o fall on Sunday and has been weak ever since that time , Pt has hx of CP

## 2023-12-23 NOTE — ED Notes (Signed)
 Patient transported to X-ray

## 2023-12-23 NOTE — Discharge Instructions (Addendum)
 Thank you for letting us  evaluate you today.  Your lab work is unremarkable.  Your urine was clear from blood, infection.  Your x-rays of your thoracic and lumbar spine were normal.  I have sent muscle relaxer to your pharmacy use as needed.  I have also sent naproxen to your pharmacy to use as needed for anti-inflammatory, pain medicine. You may use naproxen and Tylenol intermittently every 8 hours as needed for pain.  Please do not use naproxen with aspirin, Aleve, ibuprofen, Advil as they are all in the same family. Robaxin may cause drowsiness so do not operate heavy machinery including driving or drink alcohol with this.  You may take this at night or split the tablet in half if it makes you too drowsy.   Return to emergency department for extreme significant worsening of symptoms, urinary incontinence, loss of sensation in genital region

## 2023-12-28 DIAGNOSIS — L989 Disorder of the skin and subcutaneous tissue, unspecified: Secondary | ICD-10-CM | POA: Diagnosis not present

## 2023-12-30 ENCOUNTER — Ambulatory Visit: Admitting: Family

## 2024-06-03 ENCOUNTER — Other Ambulatory Visit (INDEPENDENT_AMBULATORY_CARE_PROVIDER_SITE_OTHER): Payer: Self-pay

## 2024-06-03 ENCOUNTER — Ambulatory Visit (INDEPENDENT_AMBULATORY_CARE_PROVIDER_SITE_OTHER): Admitting: Family

## 2024-06-03 DIAGNOSIS — M25571 Pain in right ankle and joints of right foot: Secondary | ICD-10-CM

## 2024-06-07 ENCOUNTER — Encounter: Payer: Self-pay | Admitting: Family

## 2024-06-07 NOTE — Progress Notes (Signed)
 Office Visit Note   Patient: Shawn Tucker           Date of Birth: 1995/09/19           MRN: [id number] Visit Date: 06/03/2024              Requested by: No referring provider defined for this encounter. PCP: Patient, No Pcp Per  Chief Complaint  Patient presents with   Right Ankle - Pain      HPI: The patient is a 28 year old gentleman who presents today for evaluation of a right ankle injury after he slipped and fell twisting his right ankle at home inversion injury.  This was approximately 3 weeks ago.  He has been icing and elevating he is able to bear weight and is concerned he may have a fracture.  Would like to rule out fracture before return to work  Does have a history of cerebral palsy with spastic dysplasia  Assessment & Plan: Visit Diagnoses:  1. Pain in right ankle and joints of right foot    Plan: Reassurance provided.  Given an ASO he will use this for work or exercise for the next 4 weeks.  Discussed range of motion and strengthening of the ankle at home.  May return to work. Follow-Up Instructions: No follow-ups on file.   Right Ankle Exam   Tenderness  The patient is experiencing tenderness in the ATF. Swelling: mild  Range of Motion  The patient has normal right ankle ROM.  Other  Erythema: absent Sensation: normal Pulse: present       Patient is alert, oriented, no adenopathy, well-dressed, normal affect, normal respiratory effort.     Imaging: No results found. No images are attached to the encounter.  Labs: No results found for: HGBA1C, ESRSEDRATE, CRP, LABURIC, REPTSTATUS, GRAMSTAIN, CULT, LABORGA   No results found for: ALBUMIN, PREALBUMIN, CBC  No results found for: MG No results found for: VD25OH  No results found for: PREALBUMIN    Latest Ref Rng & Units 12/23/2023    5:39 PM 01/14/2016   12:15 PM  CBC EXTENDED  WBC 4.0 - 10.5 K/uL 10.3  4.7   RBC 4.22 - 5.81 MIL/uL 4.58  4.41   Hemoglobin  13.0 - 17.0 g/dL 84.8  85.5   HCT 60.9 - 52.0 % 43.5  42.5   Platelets 150 - 400 K/uL 213  223   NEUT# 1.7 - 7.7 K/uL 8.4    Lymph# 0.7 - 4.0 K/uL 1.4       There is no height or weight on file to calculate BMI.  Orders:  Orders Placed This Encounter  Procedures   XR Ankle Complete Right   No orders of the defined types were placed in this encounter.    Procedures: No procedures performed  Clinical Data: No additional findings.  ROS:  All other systems negative, except as noted in the HPI. Review of Systems  Objective: Vital Signs: There were no vitals taken for this visit.  Specialty Comments:  No specialty comments available.  PMFS History: Patient Active Problem List   Diagnosis Date Noted   Chronic pain of left ankle 02/03/2019   Obesity 12/04/2017   Inguinal lymphadenopathy - left groin 01/04/2016   Mass of left inguinal region 12/31/2015   Congenital diplegia (HCC) 07/29/2013   Attention deficit hyperactivity disorder (ADHD) 07/29/2013   Mild intellectual disability 07/29/2013   Past Medical History:  Diagnosis Date   Asthma    Right axillary hidradenitis 05/2016  recurrent   Spastic diplegic cerebral palsy (HCC)    uses hand brace walking device at times    No family history on file.  Past Surgical History:  Procedure Laterality Date   AXILLARY HIDRADENITIS EXCISION Right 07/10/2015   EXCISION OF ACCESSORY NAVICULAR WITH ADVANCEMENT OF POSTERIOR TIBIAL TENDON Left 02/23/2014   foot   FOOT CAPSULE RELEASE W/ PERCUTANEOUS HEEL CORD LENGTHENING, TIBIAL TENDON TRANSFER Bilateral    HYDRADENITIS EXCISION Right 06/11/2016   Procedure: EXCISION RECURRENT  HIDRADENITIS RIGHT AXILLA;  Surgeon: Donnice Lima, MD;  Location: [facility];  Service: General;  Laterality: Right;   HYDRADENITIS EXCISION Bilateral 07/30/2023   Procedure: EXCISION OF BILATERAL AXILLARY HIDRADENITIS;  Surgeon: Lima Donnice, MD;  Location:  SURGERY CENTER;   Service: General;  Laterality: Bilateral;  LMA ANESTHESIA   RHIZOTOMY  2003   Social History   Occupational History   Not on file  Tobacco Use   Smoking status: Never   Smokeless tobacco: Never  Vaping Use   Vaping status: Never Used  Substance and Sexual Activity   Alcohol use: No   Drug use: No   Sexual activity: Never

## 2024-07-06 DIAGNOSIS — R269 Unspecified abnormalities of gait and mobility: Secondary | ICD-10-CM | POA: Diagnosis not present

## 2024-07-06 DIAGNOSIS — G809 Cerebral palsy, unspecified: Secondary | ICD-10-CM | POA: Diagnosis not present

## 2024-07-06 DIAGNOSIS — Z23 Encounter for immunization: Secondary | ICD-10-CM | POA: Diagnosis not present

## 2024-08-22 DIAGNOSIS — Z0000 Encounter for general adult medical examination without abnormal findings: Secondary | ICD-10-CM | POA: Diagnosis not present

## 2024-08-22 DIAGNOSIS — G809 Cerebral palsy, unspecified: Secondary | ICD-10-CM | POA: Diagnosis not present

## 2024-08-22 DIAGNOSIS — Z13220 Encounter for screening for lipoid disorders: Secondary | ICD-10-CM | POA: Diagnosis not present
